# Patient Record
Sex: Female | Born: 1960
Health system: Southern US, Community
[De-identification: ages and names within clinical notes are randomized; demographics above are authoritative.]

## PROBLEM LIST (undated history)

## (undated) DIAGNOSIS — M549 Dorsalgia, unspecified: Secondary | ICD-10-CM

## (undated) DIAGNOSIS — G5601 Carpal tunnel syndrome, right upper limb: Secondary | ICD-10-CM

## (undated) DIAGNOSIS — E785 Hyperlipidemia, unspecified: Secondary | ICD-10-CM

## (undated) DIAGNOSIS — G43909 Migraine, unspecified, not intractable, without status migrainosus: Secondary | ICD-10-CM

## (undated) DIAGNOSIS — G4730 Sleep apnea, unspecified: Secondary | ICD-10-CM

## (undated) DIAGNOSIS — E119 Type 2 diabetes mellitus without complications: Secondary | ICD-10-CM

## (undated) DIAGNOSIS — M797 Fibromyalgia: Secondary | ICD-10-CM

## (undated) DIAGNOSIS — F329 Major depressive disorder, single episode, unspecified: Secondary | ICD-10-CM

## (undated) DIAGNOSIS — D219 Benign neoplasm of connective and other soft tissue, unspecified: Secondary | ICD-10-CM

## (undated) DIAGNOSIS — M1990 Unspecified osteoarthritis, unspecified site: Secondary | ICD-10-CM

## (undated) DIAGNOSIS — N920 Excessive and frequent menstruation with regular cycle: Secondary | ICD-10-CM

## (undated) DIAGNOSIS — B009 Herpesviral infection, unspecified: Secondary | ICD-10-CM

## (undated) DIAGNOSIS — Z9289 Personal history of other medical treatment: Secondary | ICD-10-CM

## (undated) DIAGNOSIS — I10 Essential (primary) hypertension: Secondary | ICD-10-CM

## (undated) DIAGNOSIS — D649 Anemia, unspecified: Secondary | ICD-10-CM

## (undated) DIAGNOSIS — G8929 Other chronic pain: Secondary | ICD-10-CM

## (undated) DIAGNOSIS — T7840XA Allergy, unspecified, initial encounter: Secondary | ICD-10-CM

## (undated) DIAGNOSIS — F419 Anxiety disorder, unspecified: Secondary | ICD-10-CM

## (undated) DIAGNOSIS — F32A Depression, unspecified: Secondary | ICD-10-CM

## (undated) HISTORY — DX: Other chronic pain: G89.29

## (undated) HISTORY — DX: Hyperlipidemia, unspecified: E78.5

## (undated) HISTORY — PX: ENDOMETRIAL ABLATION: SHX621

## (undated) HISTORY — DX: Depression, unspecified: F32.A

## (undated) HISTORY — DX: Benign neoplasm of connective and other soft tissue, unspecified: D21.9

## (undated) HISTORY — PX: NECK SURGERY: SHX720

## (undated) HISTORY — DX: Major depressive disorder, single episode, unspecified: F32.9

## (undated) HISTORY — DX: Dorsalgia, unspecified: M54.9

## (undated) HISTORY — DX: Migraine, unspecified, not intractable, without status migrainosus: G43.909

## (undated) HISTORY — DX: Anemia, unspecified: D64.9

## (undated) HISTORY — PX: COLONOSCOPY: SHX174

## (undated) HISTORY — PX: TUBAL LIGATION: SHX77

## (undated) HISTORY — DX: Sleep apnea, unspecified: G47.30

## (undated) HISTORY — DX: Allergy, unspecified, initial encounter: T78.40XA

## (undated) HISTORY — PX: OTHER SURGICAL HISTORY: SHX169

## (undated) HISTORY — DX: Type 2 diabetes mellitus without complications: E11.9

## (undated) HISTORY — DX: Excessive and frequent menstruation with regular cycle: N92.0

## (undated) HISTORY — DX: Morbid (severe) obesity due to excess calories: E66.01

## (undated) HISTORY — PX: WISDOM TOOTH EXTRACTION: SHX21

---

## 1998-07-04 ENCOUNTER — Emergency Department (HOSPITAL_COMMUNITY): Admission: EM | Admit: 1998-07-04 | Discharge: 1998-07-04 | Payer: Self-pay | Admitting: Emergency Medicine

## 1998-07-09 ENCOUNTER — Ambulatory Visit (HOSPITAL_COMMUNITY): Admission: RE | Admit: 1998-07-09 | Discharge: 1998-07-09 | Payer: Self-pay | Admitting: Cardiology

## 1998-12-11 ENCOUNTER — Other Ambulatory Visit: Admission: RE | Admit: 1998-12-11 | Discharge: 1998-12-11 | Payer: Self-pay | Admitting: Obstetrics

## 1999-08-26 ENCOUNTER — Encounter: Payer: Self-pay | Admitting: Cardiology

## 1999-08-26 ENCOUNTER — Ambulatory Visit (HOSPITAL_COMMUNITY): Admission: RE | Admit: 1999-08-26 | Discharge: 1999-08-26 | Payer: Self-pay | Admitting: Cardiology

## 1999-09-20 ENCOUNTER — Encounter: Admission: RE | Admit: 1999-09-20 | Discharge: 1999-09-20 | Payer: Self-pay | Admitting: Obstetrics

## 1999-09-20 ENCOUNTER — Encounter: Payer: Self-pay | Admitting: Obstetrics

## 2000-03-10 ENCOUNTER — Encounter: Admission: RE | Admit: 2000-03-10 | Discharge: 2000-03-10 | Payer: Self-pay | Admitting: Cardiology

## 2000-03-10 ENCOUNTER — Encounter: Payer: Self-pay | Admitting: Cardiology

## 2000-03-25 ENCOUNTER — Encounter: Payer: Self-pay | Admitting: Obstetrics

## 2000-03-25 ENCOUNTER — Encounter: Admission: RE | Admit: 2000-03-25 | Discharge: 2000-03-25 | Payer: Self-pay | Admitting: Obstetrics

## 2000-11-25 ENCOUNTER — Other Ambulatory Visit: Admission: RE | Admit: 2000-11-25 | Discharge: 2000-11-25 | Payer: Self-pay | Admitting: Obstetrics

## 2000-12-06 ENCOUNTER — Emergency Department (HOSPITAL_COMMUNITY): Admission: EM | Admit: 2000-12-06 | Discharge: 2000-12-06 | Payer: Self-pay | Admitting: Emergency Medicine

## 2001-03-30 ENCOUNTER — Encounter: Admission: RE | Admit: 2001-03-30 | Discharge: 2001-03-30 | Payer: Self-pay | Admitting: Obstetrics

## 2001-03-30 ENCOUNTER — Encounter: Payer: Self-pay | Admitting: Obstetrics

## 2001-05-19 ENCOUNTER — Encounter: Payer: Self-pay | Admitting: Cardiology

## 2001-05-19 ENCOUNTER — Encounter: Admission: RE | Admit: 2001-05-19 | Discharge: 2001-05-19 | Payer: Self-pay | Admitting: Cardiology

## 2001-09-28 ENCOUNTER — Emergency Department (HOSPITAL_COMMUNITY): Admission: EM | Admit: 2001-09-28 | Discharge: 2001-09-28 | Payer: Self-pay | Admitting: Emergency Medicine

## 2002-04-07 ENCOUNTER — Encounter: Payer: Self-pay | Admitting: Obstetrics

## 2002-04-07 ENCOUNTER — Encounter: Admission: RE | Admit: 2002-04-07 | Discharge: 2002-04-07 | Payer: Self-pay | Admitting: Obstetrics

## 2002-04-07 ENCOUNTER — Encounter: Payer: Self-pay | Admitting: Cardiology

## 2002-09-13 ENCOUNTER — Emergency Department (HOSPITAL_COMMUNITY): Admission: EM | Admit: 2002-09-13 | Discharge: 2002-09-13 | Payer: Self-pay | Admitting: Emergency Medicine

## 2003-01-27 ENCOUNTER — Emergency Department (HOSPITAL_COMMUNITY): Admission: EM | Admit: 2003-01-27 | Discharge: 2003-01-27 | Payer: Self-pay | Admitting: Emergency Medicine

## 2003-04-13 ENCOUNTER — Encounter: Admission: RE | Admit: 2003-04-13 | Discharge: 2003-04-13 | Payer: Self-pay | Admitting: Obstetrics

## 2003-04-13 ENCOUNTER — Encounter: Payer: Self-pay | Admitting: Obstetrics

## 2003-05-06 ENCOUNTER — Encounter: Payer: Self-pay | Admitting: Emergency Medicine

## 2003-05-06 ENCOUNTER — Emergency Department (HOSPITAL_COMMUNITY): Admission: EM | Admit: 2003-05-06 | Discharge: 2003-05-06 | Payer: Self-pay | Admitting: Emergency Medicine

## 2003-05-27 ENCOUNTER — Encounter: Admission: RE | Admit: 2003-05-27 | Discharge: 2003-05-27 | Payer: Self-pay | Admitting: Cardiology

## 2003-05-27 ENCOUNTER — Encounter: Payer: Self-pay | Admitting: Cardiology

## 2003-09-25 ENCOUNTER — Encounter: Admission: RE | Admit: 2003-09-25 | Discharge: 2003-09-25 | Payer: Self-pay | Admitting: Obstetrics

## 2003-11-21 ENCOUNTER — Encounter: Admission: RE | Admit: 2003-11-21 | Discharge: 2003-11-21 | Payer: Self-pay | Admitting: Cardiology

## 2003-12-14 ENCOUNTER — Emergency Department (HOSPITAL_COMMUNITY): Admission: EM | Admit: 2003-12-14 | Discharge: 2003-12-14 | Payer: Self-pay | Admitting: Family Medicine

## 2005-04-09 ENCOUNTER — Emergency Department (HOSPITAL_COMMUNITY): Admission: EM | Admit: 2005-04-09 | Discharge: 2005-04-09 | Payer: Self-pay | Admitting: Family Medicine

## 2005-09-23 ENCOUNTER — Ambulatory Visit (HOSPITAL_COMMUNITY): Admission: RE | Admit: 2005-09-23 | Discharge: 2005-09-23 | Payer: Self-pay | Admitting: Obstetrics

## 2005-10-07 ENCOUNTER — Encounter: Admission: RE | Admit: 2005-10-07 | Discharge: 2005-10-07 | Payer: Self-pay | Admitting: Cardiology

## 2005-11-10 ENCOUNTER — Ambulatory Visit (HOSPITAL_COMMUNITY): Admission: RE | Admit: 2005-11-10 | Discharge: 2005-11-10 | Payer: Self-pay | Admitting: Cardiology

## 2005-12-16 ENCOUNTER — Ambulatory Visit: Payer: Self-pay | Admitting: Internal Medicine

## 2006-01-20 ENCOUNTER — Ambulatory Visit: Payer: Self-pay | Admitting: Internal Medicine

## 2006-01-22 ENCOUNTER — Ambulatory Visit (HOSPITAL_COMMUNITY): Admission: RE | Admit: 2006-01-22 | Discharge: 2006-01-22 | Payer: Self-pay | Admitting: Neurosurgery

## 2006-01-29 ENCOUNTER — Encounter: Admission: RE | Admit: 2006-01-29 | Discharge: 2006-01-29 | Payer: Self-pay | Admitting: Neurosurgery

## 2006-02-17 ENCOUNTER — Inpatient Hospital Stay (HOSPITAL_COMMUNITY): Admission: RE | Admit: 2006-02-17 | Discharge: 2006-02-19 | Payer: Self-pay | Admitting: Neurosurgery

## 2006-03-26 ENCOUNTER — Encounter: Admission: RE | Admit: 2006-03-26 | Discharge: 2006-03-26 | Payer: Self-pay | Admitting: *Deleted

## 2006-04-18 ENCOUNTER — Emergency Department (HOSPITAL_COMMUNITY): Admission: EM | Admit: 2006-04-18 | Discharge: 2006-04-18 | Payer: Self-pay | Admitting: Family Medicine

## 2006-06-04 ENCOUNTER — Ambulatory Visit: Payer: Self-pay | Admitting: Physical Medicine & Rehabilitation

## 2006-06-04 ENCOUNTER — Encounter
Admission: RE | Admit: 2006-06-04 | Discharge: 2006-09-02 | Payer: Self-pay | Admitting: Physical Medicine & Rehabilitation

## 2006-06-11 ENCOUNTER — Encounter
Admission: RE | Admit: 2006-06-11 | Discharge: 2006-07-03 | Payer: Self-pay | Admitting: Physical Medicine & Rehabilitation

## 2006-07-14 ENCOUNTER — Ambulatory Visit: Payer: Self-pay | Admitting: Physical Medicine & Rehabilitation

## 2006-08-06 ENCOUNTER — Ambulatory Visit: Payer: Self-pay | Admitting: Physical Medicine & Rehabilitation

## 2006-09-04 ENCOUNTER — Encounter
Admission: RE | Admit: 2006-09-04 | Discharge: 2006-12-03 | Payer: Self-pay | Admitting: Physical Medicine & Rehabilitation

## 2006-09-30 ENCOUNTER — Ambulatory Visit: Payer: Self-pay | Admitting: Physical Medicine & Rehabilitation

## 2006-10-02 ENCOUNTER — Ambulatory Visit (HOSPITAL_COMMUNITY): Admission: RE | Admit: 2006-10-02 | Discharge: 2006-10-02 | Payer: Self-pay | Admitting: Obstetrics

## 2006-11-24 ENCOUNTER — Encounter
Admission: RE | Admit: 2006-11-24 | Discharge: 2007-02-22 | Payer: Self-pay | Admitting: Physical Medicine & Rehabilitation

## 2006-12-11 ENCOUNTER — Ambulatory Visit: Payer: Self-pay | Admitting: Physical Medicine & Rehabilitation

## 2007-02-04 ENCOUNTER — Ambulatory Visit: Payer: Self-pay | Admitting: Physical Medicine & Rehabilitation

## 2007-03-05 ENCOUNTER — Encounter
Admission: RE | Admit: 2007-03-05 | Discharge: 2007-06-03 | Payer: Self-pay | Admitting: Physical Medicine & Rehabilitation

## 2007-03-22 ENCOUNTER — Encounter
Admission: RE | Admit: 2007-03-22 | Discharge: 2007-05-17 | Payer: Self-pay | Admitting: Physical Medicine & Rehabilitation

## 2007-05-05 ENCOUNTER — Emergency Department (HOSPITAL_COMMUNITY): Admission: EM | Admit: 2007-05-05 | Discharge: 2007-05-05 | Payer: Self-pay | Admitting: Emergency Medicine

## 2007-05-07 ENCOUNTER — Ambulatory Visit: Payer: Self-pay | Admitting: Internal Medicine

## 2007-05-07 ENCOUNTER — Ambulatory Visit: Payer: Self-pay | Admitting: *Deleted

## 2007-05-07 ENCOUNTER — Ambulatory Visit (HOSPITAL_COMMUNITY): Admission: RE | Admit: 2007-05-07 | Discharge: 2007-05-07 | Payer: Self-pay | Admitting: Internal Medicine

## 2007-05-14 ENCOUNTER — Ambulatory Visit: Payer: Self-pay | Admitting: Internal Medicine

## 2007-08-09 ENCOUNTER — Ambulatory Visit: Payer: Self-pay | Admitting: Internal Medicine

## 2007-08-09 ENCOUNTER — Encounter: Payer: Self-pay | Admitting: Family Medicine

## 2007-08-09 LAB — CONVERTED CEMR LAB
BUN: 13 mg/dL (ref 6–23)
Basophils Absolute: 0 10*3/uL (ref 0.0–0.1)
Basophils Relative: 0 % (ref 0–1)
Chlamydia, DNA Probe: NEGATIVE
Chloride: 103 meq/L (ref 96–112)
Eosinophils Absolute: 0.1 10*3/uL — ABNORMAL LOW (ref 0.2–0.7)
FSH: 3.3 milliintl units/mL
GC Probe Amp, Genital: NEGATIVE
MCHC: 30.8 g/dL (ref 30.0–36.0)
MCV: 76.1 fL — ABNORMAL LOW (ref 78.0–100.0)
Monocytes Relative: 9 % (ref 3–12)
Neutro Abs: 5.6 10*3/uL (ref 1.7–7.7)
Neutrophils Relative %: 65 % (ref 43–77)
Platelets: 430 10*3/uL — ABNORMAL HIGH (ref 150–400)
Potassium: 3.8 meq/L (ref 3.5–5.3)
RBC: 4.27 M/uL (ref 3.87–5.11)
RDW: 17.7 % — ABNORMAL HIGH (ref 11.5–15.5)
Sodium: 137 meq/L (ref 135–145)

## 2007-08-24 ENCOUNTER — Ambulatory Visit: Payer: Self-pay | Admitting: Internal Medicine

## 2007-08-24 LAB — CONVERTED CEMR LAB
Cholesterol: 220 mg/dL — ABNORMAL HIGH (ref 0–200)
HDL: 49 mg/dL (ref >39)
LDL Cholesterol: 158 mg/dL — ABNORMAL HIGH (ref 0–99)
Triglycerides: 63 mg/dL (ref <150)

## 2007-09-21 ENCOUNTER — Ambulatory Visit: Payer: Self-pay | Admitting: Internal Medicine

## 2007-10-08 ENCOUNTER — Ambulatory Visit (HOSPITAL_COMMUNITY): Admission: RE | Admit: 2007-10-08 | Discharge: 2007-10-08 | Payer: Self-pay | Admitting: Family Medicine

## 2007-10-12 ENCOUNTER — Ambulatory Visit: Payer: Self-pay | Admitting: Internal Medicine

## 2007-10-12 LAB — CONVERTED CEMR LAB
AST: 13 units/L (ref 0–37)
Albumin: 4.4 g/dL (ref 3.5–5.2)
BUN: 16 mg/dL (ref 6–23)
CO2: 23 meq/L (ref 19–32)
Calcium: 9.3 mg/dL (ref 8.4–10.5)
Chloride: 104 meq/L (ref 96–112)
Creatinine, Ser: 1.05 mg/dL (ref 0.40–1.20)
Potassium: 4 meq/L (ref 3.5–5.3)

## 2007-10-25 ENCOUNTER — Ambulatory Visit: Payer: Self-pay | Admitting: Internal Medicine

## 2007-11-29 ENCOUNTER — Ambulatory Visit: Payer: Self-pay | Admitting: Internal Medicine

## 2007-11-30 ENCOUNTER — Ambulatory Visit (HOSPITAL_COMMUNITY): Admission: RE | Admit: 2007-11-30 | Discharge: 2007-11-30 | Payer: Self-pay | Admitting: Family Medicine

## 2008-01-28 ENCOUNTER — Ambulatory Visit: Payer: Self-pay | Admitting: Internal Medicine

## 2008-02-04 ENCOUNTER — Ambulatory Visit: Payer: Self-pay | Admitting: Family Medicine

## 2008-03-03 ENCOUNTER — Ambulatory Visit: Payer: Self-pay | Admitting: Internal Medicine

## 2008-09-19 ENCOUNTER — Encounter: Admission: RE | Admit: 2008-09-19 | Discharge: 2008-09-19 | Payer: Self-pay | Admitting: Cardiology

## 2008-10-09 ENCOUNTER — Ambulatory Visit (HOSPITAL_COMMUNITY): Admission: RE | Admit: 2008-10-09 | Discharge: 2008-10-09 | Payer: Self-pay | Admitting: Cardiology

## 2008-10-18 ENCOUNTER — Encounter (HOSPITAL_COMMUNITY): Admission: RE | Admit: 2008-10-18 | Discharge: 2009-01-16 | Payer: Self-pay | Admitting: Internal Medicine

## 2008-11-13 ENCOUNTER — Ambulatory Visit (HOSPITAL_COMMUNITY): Admission: RE | Admit: 2008-11-13 | Discharge: 2008-11-13 | Payer: Self-pay | Admitting: Internal Medicine

## 2008-11-24 ENCOUNTER — Encounter: Admission: RE | Admit: 2008-11-24 | Discharge: 2008-11-24 | Payer: Self-pay | Admitting: Cardiology

## 2008-12-28 ENCOUNTER — Encounter: Admission: RE | Admit: 2008-12-28 | Discharge: 2008-12-28 | Payer: Self-pay | Admitting: Internal Medicine

## 2008-12-28 ENCOUNTER — Other Ambulatory Visit: Admission: RE | Admit: 2008-12-28 | Discharge: 2008-12-28 | Payer: Self-pay | Admitting: Interventional Radiology

## 2008-12-28 ENCOUNTER — Encounter (INDEPENDENT_AMBULATORY_CARE_PROVIDER_SITE_OTHER): Payer: Self-pay | Admitting: Interventional Radiology

## 2009-02-21 ENCOUNTER — Ambulatory Visit: Payer: Self-pay | Admitting: Family Medicine

## 2009-03-15 ENCOUNTER — Encounter: Admission: RE | Admit: 2009-03-15 | Discharge: 2009-03-15 | Payer: Self-pay | Admitting: Cardiology

## 2009-03-15 ENCOUNTER — Ambulatory Visit (HOSPITAL_BASED_OUTPATIENT_CLINIC_OR_DEPARTMENT_OTHER): Admission: RE | Admit: 2009-03-15 | Discharge: 2009-03-15 | Payer: Self-pay | Admitting: Cardiology

## 2009-03-17 ENCOUNTER — Ambulatory Visit: Payer: Self-pay | Admitting: Internal Medicine

## 2009-03-22 ENCOUNTER — Ambulatory Visit (HOSPITAL_COMMUNITY): Admission: RE | Admit: 2009-03-22 | Discharge: 2009-03-22 | Payer: Self-pay | Admitting: Cardiology

## 2009-03-26 ENCOUNTER — Emergency Department (HOSPITAL_COMMUNITY): Admission: EM | Admit: 2009-03-26 | Discharge: 2009-03-26 | Payer: Self-pay | Admitting: Emergency Medicine

## 2009-03-28 ENCOUNTER — Emergency Department (HOSPITAL_COMMUNITY): Admission: EM | Admit: 2009-03-28 | Discharge: 2009-03-28 | Payer: Self-pay | Admitting: Emergency Medicine

## 2009-03-30 ENCOUNTER — Ambulatory Visit: Payer: Self-pay | Admitting: Internal Medicine

## 2009-06-22 ENCOUNTER — Inpatient Hospital Stay (HOSPITAL_COMMUNITY): Admission: RE | Admit: 2009-06-22 | Discharge: 2009-06-23 | Payer: Self-pay | Admitting: Neurosurgery

## 2009-06-22 IMAGING — CR DG CERVICAL SPINE 2 OR 3 VIEWS
1 series · 1 of 1 positions shown · non-contrast
Comparison: Cervical spine x-rays [DATE].

CLINICAL DATA: C5-6 disc herniation.  ACDF.

CERVICAL SPINE - 2-3 VIEW [DATE]:

[view not recorded]
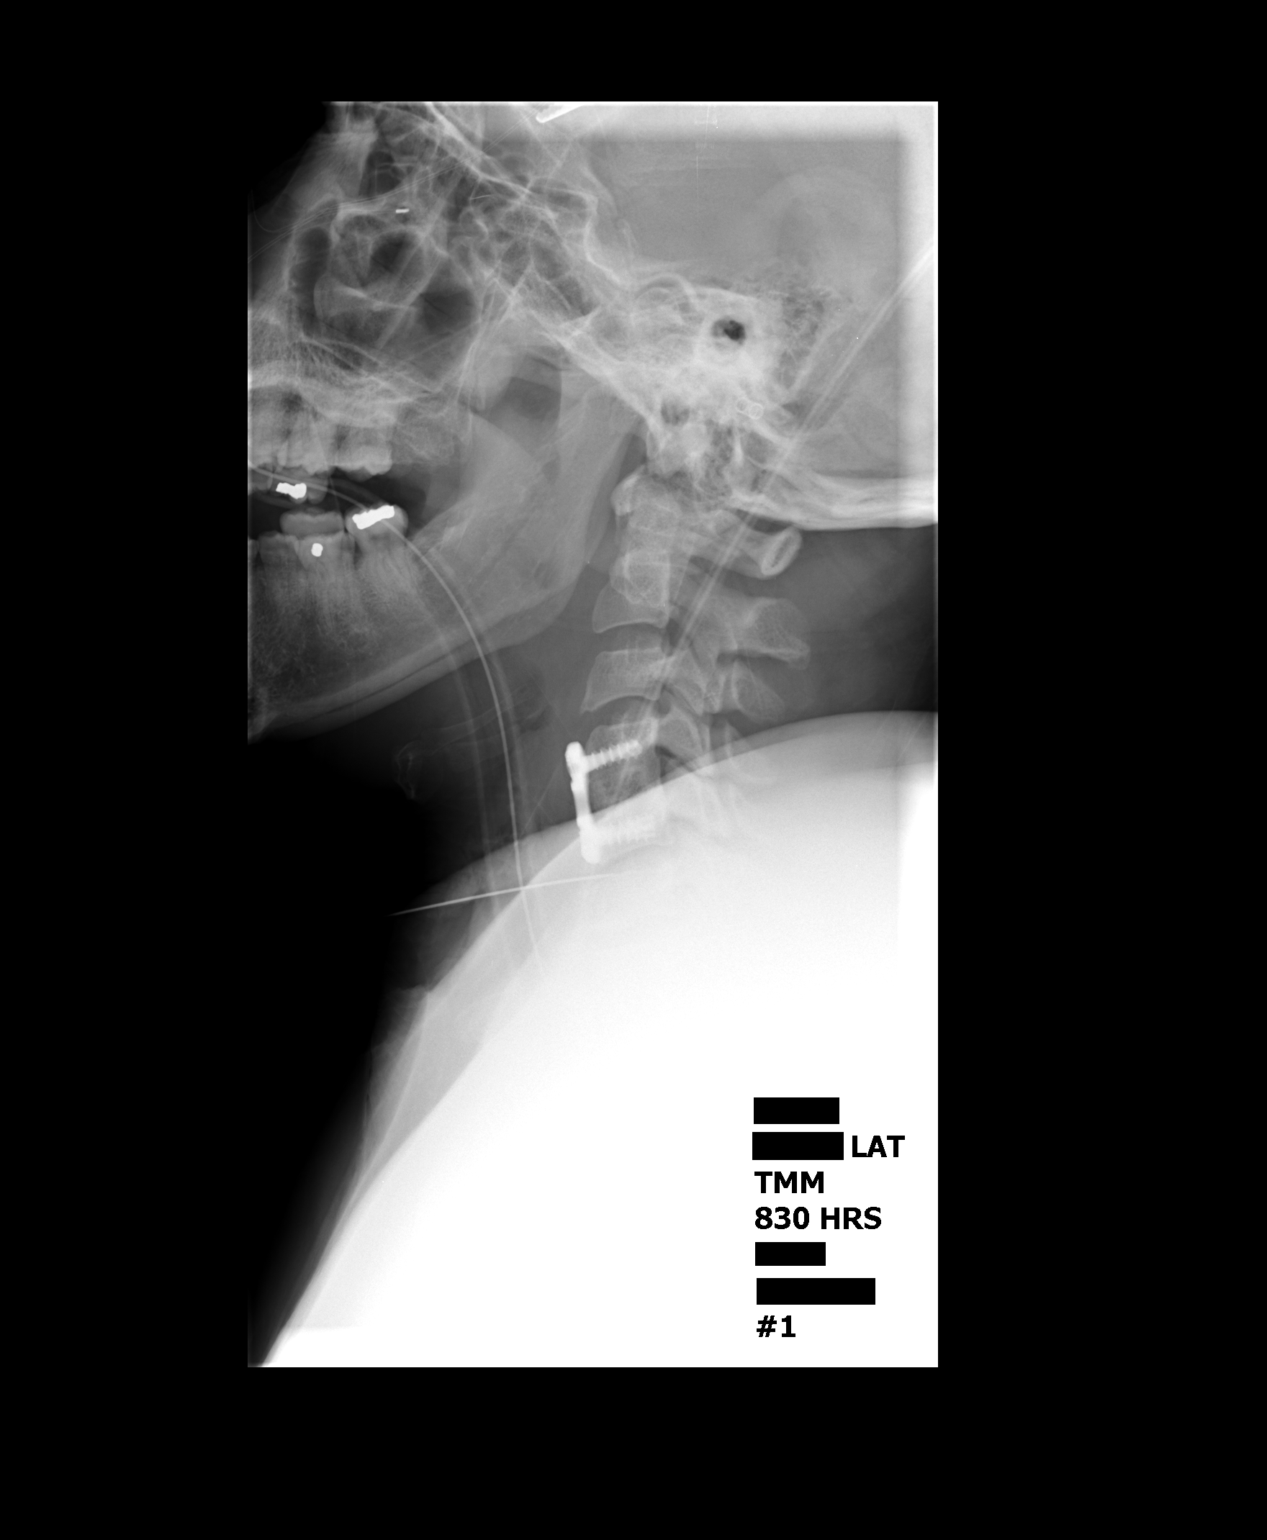

[1 of 1 positions shown; findings below may reference images not displayed]

FINDINGS: Images were submitted for interpretation post-
operatively.  Initial image obtained at [VY] hours demonstrates the
localizer needle projected over the C5-6 disc space.  Prior C4-5
ACDF.  Second image obtained at [VY] hours demonstrates removal of
the C4-5 hardware with placement of hardware at C5-6.  Bone fusion
plug appropriately positioned in the C5-6 disc space.  Alignment
anatomic.
IMPRESSION: Anatomic alignment status post ACDF with hardware C5-6.

## 2009-07-09 ENCOUNTER — Ambulatory Visit: Payer: Self-pay | Admitting: Internal Medicine

## 2009-07-10 ENCOUNTER — Encounter: Payer: Self-pay | Admitting: Family Medicine

## 2009-08-13 ENCOUNTER — Encounter: Admission: RE | Admit: 2009-08-13 | Discharge: 2009-08-13 | Payer: Self-pay | Admitting: Neurosurgery

## 2009-08-14 ENCOUNTER — Encounter: Admission: RE | Admit: 2009-08-14 | Discharge: 2009-08-14 | Payer: Self-pay | Admitting: Cardiology

## 2009-09-10 ENCOUNTER — Ambulatory Visit: Payer: Self-pay | Admitting: Internal Medicine

## 2009-09-10 LAB — CONVERTED CEMR LAB
BUN: 11 mg/dL
CO2: 19 meq/L
Calcium: 9 mg/dL
Chlamydia, DNA Probe: NEGATIVE
Chloride: 103 meq/L
Cholesterol: 146 mg/dL
Creatinine, Ser: 0.93 mg/dL
Direct LDL: 86 mg/dL
GC Probe Amp, Genital: NEGATIVE
Glucose, Bld: 104 mg/dL — ABNORMAL HIGH
HDL: 48 mg/dL
LDL Cholesterol: 84 mg/dL
Potassium: 4.1 meq/L
Sodium: 140 meq/L
Total CHOL/HDL Ratio: 3
Triglycerides: 72 mg/dL
VLDL: 14 mg/dL

## 2009-10-10 ENCOUNTER — Ambulatory Visit (HOSPITAL_COMMUNITY): Admission: RE | Admit: 2009-10-10 | Discharge: 2009-10-10 | Payer: Self-pay | Admitting: Cardiology

## 2010-01-01 ENCOUNTER — Emergency Department (HOSPITAL_COMMUNITY): Admission: EM | Admit: 2010-01-01 | Discharge: 2010-01-01 | Payer: Self-pay | Admitting: Family Medicine

## 2010-01-14 ENCOUNTER — Ambulatory Visit (HOSPITAL_COMMUNITY): Admission: RE | Admit: 2010-01-14 | Discharge: 2010-01-14 | Payer: Self-pay | Admitting: Internal Medicine

## 2010-04-06 ENCOUNTER — Emergency Department (HOSPITAL_COMMUNITY): Admission: EM | Admit: 2010-04-06 | Discharge: 2010-04-06 | Payer: Self-pay | Admitting: Family Medicine

## 2010-04-07 ENCOUNTER — Ambulatory Visit: Payer: Self-pay | Admitting: Nurse Practitioner

## 2010-04-07 ENCOUNTER — Inpatient Hospital Stay (HOSPITAL_COMMUNITY): Admission: AD | Admit: 2010-04-07 | Discharge: 2010-04-07 | Payer: Self-pay | Admitting: Obstetrics and Gynecology

## 2010-04-28 ENCOUNTER — Inpatient Hospital Stay (HOSPITAL_COMMUNITY): Admission: AD | Admit: 2010-04-28 | Discharge: 2010-04-29 | Payer: Self-pay | Admitting: Obstetrics and Gynecology

## 2010-05-22 ENCOUNTER — Encounter: Payer: Self-pay | Admitting: Physician Assistant

## 2010-05-22 ENCOUNTER — Ambulatory Visit: Payer: Self-pay | Admitting: Obstetrics & Gynecology

## 2010-05-22 ENCOUNTER — Other Ambulatory Visit: Admission: RE | Admit: 2010-05-22 | Discharge: 2010-05-22 | Payer: Self-pay | Admitting: Obstetrics & Gynecology

## 2010-05-22 LAB — CONVERTED CEMR LAB
HCT: 22.6 % — ABNORMAL LOW (ref 36.0–46.0)
MCHC: 29.6 g/dL — ABNORMAL LOW (ref 30.0–36.0)
MCV: 77.1 fL — ABNORMAL LOW (ref 78.0–100.0)
RBC: 2.93 M/uL — ABNORMAL LOW (ref 3.87–5.11)
WBC: 9.7 10*3/uL (ref 4.0–10.5)

## 2010-06-06 ENCOUNTER — Ambulatory Visit: Payer: Self-pay | Admitting: Obstetrics and Gynecology

## 2010-07-25 ENCOUNTER — Ambulatory Visit: Payer: Self-pay | Admitting: Obstetrics & Gynecology

## 2010-08-13 ENCOUNTER — Ambulatory Visit (HOSPITAL_COMMUNITY)
Admission: RE | Admit: 2010-08-13 | Discharge: 2010-08-13 | Payer: Self-pay | Source: Home / Self Care | Admitting: Obstetrics & Gynecology

## 2010-08-26 ENCOUNTER — Encounter: Payer: Self-pay | Admitting: Orthopedic Surgery

## 2010-09-11 ENCOUNTER — Ambulatory Visit
Admission: RE | Admit: 2010-09-11 | Discharge: 2010-09-11 | Payer: Self-pay | Source: Home / Self Care | Attending: Obstetrics & Gynecology | Admitting: Obstetrics & Gynecology

## 2010-09-26 ENCOUNTER — Other Ambulatory Visit (HOSPITAL_COMMUNITY): Payer: Self-pay | Admitting: *Deleted

## 2010-09-26 DIAGNOSIS — Z0000 Encounter for general adult medical examination without abnormal findings: Secondary | ICD-10-CM

## 2010-09-26 DIAGNOSIS — Z1231 Encounter for screening mammogram for malignant neoplasm of breast: Secondary | ICD-10-CM

## 2010-10-11 ENCOUNTER — Ambulatory Visit (HOSPITAL_COMMUNITY): Admission: RE | Admit: 2010-10-11 | Payer: Self-pay | Source: Home / Self Care | Admitting: Obstetrics & Gynecology

## 2010-10-11 ENCOUNTER — Ambulatory Visit (HOSPITAL_COMMUNITY): Payer: Medicare Other

## 2010-10-14 ENCOUNTER — Ambulatory Visit (HOSPITAL_COMMUNITY): Payer: Self-pay

## 2010-10-17 ENCOUNTER — Ambulatory Visit (HOSPITAL_COMMUNITY)
Admission: RE | Admit: 2010-10-17 | Discharge: 2010-10-17 | Disposition: A | Discharge status: Home / Self Care | Payer: Medicare Other | Source: Ambulatory Visit | Attending: Cardiology | Admitting: Cardiology

## 2010-10-17 DIAGNOSIS — Z1231 Encounter for screening mammogram for malignant neoplasm of breast: Secondary | ICD-10-CM

## 2010-11-19 LAB — CBC
MCH: 23.5 pg — ABNORMAL LOW (ref 26.0–34.0)
MCHC: 30.8 g/dL (ref 30.0–36.0)
MCV: 76.2 fL — ABNORMAL LOW (ref 78.0–100.0)
Platelets: 365 10*3/uL (ref 150–400)
RDW: 22.3 % — ABNORMAL HIGH (ref 11.5–15.5)

## 2010-11-19 LAB — BASIC METABOLIC PANEL
BUN: 10 mg/dL (ref 6–23)
CO2: 26 mEq/L (ref 19–32)
Chloride: 105 mEq/L (ref 96–112)
Creatinine, Ser: 0.77 mg/dL (ref 0.4–1.2)
Glucose, Bld: 122 mg/dL — ABNORMAL HIGH (ref 70–99)

## 2010-11-19 LAB — TYPE AND SCREEN

## 2010-11-22 LAB — CROSSMATCH
ABO/RH(D): A POS
Antibody Screen: NEGATIVE

## 2010-11-22 LAB — CBC
HCT: 22.3 % — ABNORMAL LOW (ref 36.0–46.0)
HCT: 32.9 % — ABNORMAL LOW (ref 36.0–46.0)
Hemoglobin: 7 g/dL — ABNORMAL LOW (ref 12.0–15.0)
MCH: 24.5 pg — ABNORMAL LOW (ref 26.0–34.0)
MCHC: 31.5 g/dL (ref 30.0–36.0)
MCHC: 32.5 g/dL (ref 30.0–36.0)
MCV: 83.5 fL (ref 78.0–100.0)
Platelets: 279 10*3/uL (ref 150–400)
RBC: 2.86 MIL/uL — ABNORMAL LOW (ref 3.87–5.11)
RDW: 17.4 % — ABNORMAL HIGH (ref 11.5–15.5)
RDW: 18.3 % — ABNORMAL HIGH (ref 11.5–15.5)
WBC: 13.3 10*3/uL — ABNORMAL HIGH (ref 4.0–10.5)
WBC: 9 10*3/uL (ref 4.0–10.5)

## 2010-11-23 LAB — POCT URINALYSIS DIP (DEVICE)
Bilirubin Urine: NEGATIVE
Glucose, UA: NEGATIVE mg/dL
Nitrite: POSITIVE — AB
Urobilinogen, UA: 0.2 mg/dL (ref 0.0–1.0)
pH: 6 (ref 5.0–8.0)

## 2010-11-23 LAB — POCT I-STAT, CHEM 8
BUN: 13 mg/dL (ref 6–23)
Calcium, Ion: 1.1 mmol/L — ABNORMAL LOW (ref 1.12–1.32)
Chloride: 105 mEq/L (ref 96–112)
Creatinine, Ser: 1 mg/dL (ref 0.4–1.2)
Glucose, Bld: 100 mg/dL — ABNORMAL HIGH (ref 70–99)
TCO2: 24 mmol/L (ref 0–100)

## 2010-11-23 LAB — URINE CULTURE

## 2010-11-23 LAB — WET PREP, GENITAL
Trich, Wet Prep: NONE SEEN
Yeast Wet Prep HPF POC: NONE SEEN

## 2010-11-23 LAB — GC/CHLAMYDIA PROBE AMP, GENITAL: Chlamydia, DNA Probe: NEGATIVE

## 2010-12-10 ENCOUNTER — Other Ambulatory Visit: Payer: Self-pay | Admitting: Family Medicine

## 2010-12-12 LAB — BASIC METABOLIC PANEL
BUN: 10 mg/dL (ref 6–23)
CO2: 27 mEq/L (ref 19–32)
Calcium: 9 mg/dL (ref 8.4–10.5)
Chloride: 105 mEq/L (ref 96–112)
Creatinine, Ser: 0.87 mg/dL (ref 0.4–1.2)
GFR calc Af Amer: >60 mL/min (ref >60)
Glucose, Bld: 101 mg/dL — ABNORMAL HIGH (ref 70–99)

## 2010-12-12 LAB — CBC
MCHC: 32.5 g/dL (ref 30.0–36.0)
MCV: 77.5 fL — ABNORMAL LOW (ref 78.0–100.0)
RBC: 4.24 MIL/uL (ref 3.87–5.11)
RDW: 16.7 % — ABNORMAL HIGH (ref 11.5–15.5)

## 2011-01-06 ENCOUNTER — Other Ambulatory Visit (HOSPITAL_COMMUNITY): Payer: Self-pay | Admitting: Cardiology

## 2011-01-06 DIAGNOSIS — Z1231 Encounter for screening mammogram for malignant neoplasm of breast: Secondary | ICD-10-CM

## 2011-01-09 ENCOUNTER — Ambulatory Visit: Payer: Medicare Other | Admitting: Obstetrics and Gynecology

## 2011-01-09 ENCOUNTER — Ambulatory Visit: Payer: Medicare Other | Admitting: Physician Assistant

## 2011-01-09 ENCOUNTER — Other Ambulatory Visit: Payer: Self-pay | Admitting: Obstetrics and Gynecology

## 2011-01-09 DIAGNOSIS — N920 Excessive and frequent menstruation with regular cycle: Secondary | ICD-10-CM

## 2011-01-10 NOTE — Group Therapy Note (Signed)
NAMESENIYA, STOFFERS                  ACCOUNT NO.:  [id number]  MEDICAL RECORD NO.:  [id number]           PATIENT TYPE:  A  LOCATION:  [facility]                   FACILITY:  [facility]  PHYSICIAN:  Catalina Antigua, MD     DATE OF BIRTH:  1960-11-17  DATE OF SERVICE:  01/09/2011                                 CLINIC NOTE  This is a 50 year old G2, P2 who is status post endometrial ablation for management of dysfunctional uterine bleeding in December 2011 who presents today for annual exam.  The patient is currently without any complaints.  Denies abnormal bleeding or pelvic pain.  The patient is quite concerned with the results of her recent procedure.  PAST MEDICAL HISTORY:  Significant for hypertension, anxiety, depression, and hypothyroidism.  PAST SURGICAL HISTORY:  She has had two C-section x2 and a bilateral tubal ligation.  PAST GYNECOLOGIC HISTORY:  She denies any cyst or fibroids or history of abnormal Pap smear.  FAMILY HISTORY:  Significant for hypertension.  SOCIAL HISTORY:  She denies drinking, smoking, and the use of illicit drugs.  REVIEW OF SYSTEMS:  Otherwise, within normal limits.  PHYSICAL EXAMINATION:  VITAL SIGNS:  Her blood pressure is 136/84, pulse of 104, weight of 121 kg, height of 5 feet 5 inches. LUNGS:  Clear to auscultation bilaterally. HEART:  Regular rate and rhythm. BREASTS:  Nontender, equal in size.  No expressible nipple discharge. No palpable masses or lymphadenopathy. ABDOMEN:  Soft, nontender, nondistended, but obese. PELVIC:  Normal-appearing external genitalia.  Normal-appearing vaginal mucosa and cervix.  No abnormal bleeding or discharge.  Bimanual exam was limited secondary to body habitus, but no appreciable masses were felt.  ASSESSMENT AND PLAN:  This is a 50 year old G2 P2 who is status post ThermaChoice endometrial ablation in December 2011 who is here for annual exam.  Pap smear was performed today.  The patient had her mammogram  this year.  The patient will be contacted with any abnormal results.  The patient is to return in a year p.r.n.  The patient was asked to keep track of her menstrual calendar in the event that her vaginal bleeding returns.          ______________________________ Catalina Antigua, MD    PC/MEDQ  D:  01/09/2011  T:  01/10/2011  Job:  [id number]

## 2011-01-21 NOTE — Assessment & Plan Note (Signed)
Margaret [name] is back regarding her cervical and low back pain.  She recently had  a fall out of her patio chair last week and had increased back and neck  pain.  She rates her pain a 9/10 today and describes it as sharp,  constant, and aching.  The pain interferes with general activity,  relationships with others, enjoyment of life at a 9/10 level.  Sleep is  poor.  She states that the MS Contin does not provide her significant  relief of her pain nor does the Percocet.  Soma helps somewhat with  spasms.  She had her mattress replaced on her bed with the new pillows,  and this has not changed her neck pain substantially.  She is still not  walking or doing much in the way of exercise (at least it is very  limited).   REVIEW OF SYSTEMS:  Notable for depression, anxiety, spasms, shortness  of breath.  Other pertinent positives listed above.   SOCIAL HISTORY:  Without significant change today.   PHYSICAL EXAMINATION:  VITAL SIGNS:  Blood pressure 123/72, pulse 106,  respiratory rate 18.  She is satting at 99% on room air.  GENERAL:  Patient is generally pleasant, alert and oriented x3.  NEUROMUSCULAR:  She walks without a cane today, a slightly wide-based  gait.  She had some mild tenderness in a general sense in the low back.  Cervical spine was tender along the bilateral trapeziae muscles, left  greater than right today.  She had some pain in the sternocleidomastoids  and rhomboids to a lesser extent.  Neck range is essentially 50 degrees  with forward flexion, 20 degrees with bending to the sides, and 10  degrees with extension.  Motor and sensory exam is normal in the upper  and lower extremities today.  She remains overweight.  HEART:  Regular rate and rhythm.  LUNGS:  Clear.  ABDOMEN:  Soft and nontender.   ASSESSMENT:  1. Cervical post laminectomy syndrome with associated myofascial pain.  2. Chronic low back pain related to lumbar disk disease and facet      arthropathy.  3.  Obesity.  4. Anxiety and depression.   PLAN:  1. Continue MS Contin.  Would increase to 30 mg q.12h.  2. Continue Percocet for breakthrough pain, 5 mg 1 q.12h. p.r.n., #60.  3. After informed consent, we injected three trigger points on each      shoulder, using 1% lidocaine.  Patient tolerated it well.  We      discussed appropriate exercise and stretching.  I will send her for      outpatient physical therapy to work on these issues.  She needed to      take a more active and responsible role in her healthcare and well-      being.  4. I will see her back in two months with a nurse clinic in one month.      Ranelle Oyster, M.D.  Electronically Signed     ZTS/MedQ  D:  02/05/2007 16:35:30  T:  02/05/2007 20:07:57  Job #:  [id number]

## 2011-01-21 NOTE — Procedures (Signed)
NAME:  Margaret [name], Margaret [name]                  ACCOUNT NO.:  [id number]   MEDICAL RECORD NO.:  [id number]          PATIENT TYPE:  OUT   LOCATION:  [facility]                 FACILITY[facility]  [facility]   PHYSICIAN:  Clinton D. Maple Hudson, MD, FCCP, FACPDATE OF BIRTH:  [birth year]10-1960   DATE OF STUDY:  03/15/2009                            NOCTURNAL POLYSOMNOGRAM   REFERRING PHYSICIAN:   REFERRING PHYSICIAN:  Osvaldo Shipper. Spruill, MD   INDICATIONS FOR STUDY:  Hypersomnia with sleep apnea.   EPWORTH SLEEPINESS SCORE:  Epworth sleepiness score  6/24, BMI 43.3.  Weight 260 pounds, height 65 inches.  Neck 14.5 inches.   HOME MEDICATIONS:  Charted and reviewed.   SLEEP ARCHITECTURE:  Total sleep time 353 minutes with sleep efficiency  82.2%.  Stage I was 8.1%, stage II 74.8%, stage III absent, REM 17.1% of  total sleep time.  Sleep latency 46 minutes, REM latency 198 minutes,  awake after sleep onset 31 minutes, arousal index 27.   BEDTIME MEDICATIONS:  Trazodone and Flexeril.   RESPIRATORY DATA:  Apnea/hypopnea index (AHI) 3.1 per hour.  A total of  18 events were scored including 6 obstructive apneas, 1 central apnea,  and 11 hypopneas.  Events were nonpositional.  REM AHI 13.9 per hour.  An additional 14 respiratory effort related arousals were counted for a  cumulative respiratory disturbance index (RDI) of 5.4 per hour.  There  were insufficient early events to permit CPAP titration by split  protocol on the study night.   OXYGEN DATA:  Technician heard no snoring.  Oxygen desaturated to a  nadir of 87% with mean oxygen saturation through the study of 96.3% on  room air.   CARDIAC DATA:  Normal sinus rhythm.   MOVEMENT/PARASOMNIA:  No significant movement disturbance.  No bathroom  trips.   IMPRESSION/RECOMMENDATIONS:  1. Sleep architecture was significant for frequent brief waking after      taking trazodone and Flexeril.  Percentage of time in REM was      mildly reduced.  2. Occasional respiratory  events with sleep disturbance, within normal      limits, AHI 3.1 per hour (normal range 0-5 per hour).  No snoring      heard by technician.  Minimal oxygen desaturation on room air to a      nadir of 87%.  3. The study does not support a diagnosis of medically significant      obstructive sleep apnea.  She has occasional      events, but attention to good sleep habits and treatment for      occasional insomnia may be helpful if clinically appropriate.      Clinton D. Maple Hudson, MD, [facility], FACP  Diplomate, Biomedical engineer of Sleep Medicine  Electronically Signed     CDY/MEDQ  D:  03/17/2009 16:10:96  T:  03/18/2009 01:33:19  Job:  [id number]

## 2011-01-24 NOTE — Assessment & Plan Note (Signed)
FOLLOWUP OFFICE NOTE   Margaret [name] is back regarding her cervical and low back pain.  She had been  doing fairly well with the Avinza and Percocet for her neck symptoms and  then ran out of the medications about a week or so ago.  She had missed  her appointment because of insurance problems.  Today she is back with  3/10 to 4/10 pain.  Pain is sharp, burning, dull, stabbing.  Patient has  tightness in the neck and shoulder regions.  Sleep is fair although she  usually wakes up in the morning with a lot of pain.  Pain increases with  walking, bending, sitting, and range of motion in the neck.   REVIEW OF SYSTEMS:  The patient reports spasms, confusion, depression,  anxiety.  She had some vision problems this morning, and she notes some  blurriness and watery eye.   SOCIAL HISTORY:  Is without significant change today.   PHYSICAL EXAMINATION:  VITAL SIGNS:  Blood pressure 144/82, pulse 89,  respiratory rate 16.  She is saturating 100% on room air.  GENERAL:  Patient is very pleasant, and alert and oriented x3.  She  walks without her cane today.  MUSCULOSKELETAL:  She has some tenderness in the low back and cervical  spine, with reduced range of motion today in general.  Low back exam is  similar to last visit.  Neck range of motion essentially is 40-50  degrees with forward flexion, 10 degrees extension, and 20 degrees  either side rotation.  Sensory exam is normal.  Reflexes are 2+.  HEART:  Regular rate and rhythm.  CHEST:  Clear.  ABDOMEN:  Soft, nontender.   ASSESSMENT:  1. Cervical postlaminectomy syndrome.  2. Chronic low back pain related to lumbar disk disease and facet      arthropathy.  3. Cervical and shoulder girdle myofascial pain.  4. Obesity.  5. Anxiety and depression.   PLAN:  1. Resume Avinza 30 mg daily and Percocet 5 one q.8 h. p.r.n.  Patient      will use Soma for muscle relaxant properties.  2. Continue with appropriate posture, diet and exercise.  She needs  to      examine her sleep situation and make sure her head is in a neutral      position while at rest.  3. I will see her back in about 2 months.  The patient is to see the      nurse clinic in 1 months' time.  Patient is hesitant to pursue any      type of injections or interventional treatment at this time still.      Ranelle Oyster, M.D.  Electronically Signed     ZTS/MedQ  D:  12/16/2006 12:56:01  T:  12/16/2006 13:31:39  Job #:  [id number]

## 2011-01-24 NOTE — Assessment & Plan Note (Signed)
Friday, September 04, 2006:   Margaret [name] is back regarding her chronic neck and low back pain.  She has been  doing better with the Opana 5 mg q.12 hours.  She is using less Percocet  now.  She is moving more around the house.  She is still limited with  her movement, range of motion.  She is not really able to exercise all  that much due to her discomfort.  She uses a cane for balance.  She  states that she can walk about 5-10 minutes without having to stop.  Trigger point injections were very helpful for her neck pain and it  essentially has resolved as of today.  The patient rates her pain right  now as a 5/10, describes it as sharp, intermittent, dull, stabbing.  Pain interferes with general activity, relationships with others and  enjoyment of life on a moderate level.  The pain increases with walking,  bending, sitting for prolonged periods of time.   REVIEW OF SYSTEMS:  The patient reports confusion, depression, anxiety,  tingling in the arms and dry mouth.  She denies any other  constitutional, GU, GI, cardiorespiratory complaints today.  Full review  is in the health and history section.   SOCIAL HISTORY:  Is without change.  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   PHYSICAL EXAM:  Blood pressure is 131/87, pulse is 97, respiratory rate  16, she is sating 99% on room air.  Patient is pleasant, in no acute  distress.  She alert and oriented x3.  Affect is generally bright and  appropriate.  She is walking without her cane today and was actually  moving much better.  Her low back was minimally tender to palpation.  She was able to flex to near 40 degrees today.  She extended to 15-20  degrees with some loss of balance.  Legs were 5/5 for strength.  Sensory  exam was normal.  Reflexes are 2+.  Neck range of motion was much  improved with minimal tenderness palpated throughout the trapezius,  sternocleidomastoid, splenius capitis, etc.  Spurling's test was  negative.  HEART:  Regular.  CHEST:  Clear.  ABDOMEN:  Soft, nontender.  Patient remains overweight.   ASSESSMENT:  1. Chronic cervicalgia and post laminectomy syndrome.  2. Chronic low back pain related to lumbar degenerative disk disease      and facet arthropathy.  3. Obesity.  4. Anxiety and depression.  5. Cervical/shoulder girdle myofascial pain related to #1, which has      since improved since injections.   PLAN:  1. Increase Opana ER to 10 mg q.12h.  2. Percocet 5/325 one q.8 hours p.r.n. #80.  3. Flexeril 10 mg q.8h. p.r.n. for spasm.  4. Encourage improved effort with exercise and diet.  5. Consider interventional procedures in the future.  6. I will see the patient back in about 2 months' time.  7. She will see the nurse clinic back next month.      Ranelle Oyster, M.D.  Electronically Signed     ZTS/MedQ  D:  09/04/2006 16:11:41  T:  09/04/2006 17:08:06  Job #:  [id number]   cc:   Hilda Lias, M.D.  Fax: [phone]

## 2011-01-24 NOTE — Assessment & Plan Note (Signed)
Friday, August 07, 2006:   Margaret [name] is back regarding her chronic neck and low back pain. Her neck pain  seems to have increased over the last few weeks. She does not identify  any particular cause. She may have been responsible for more work and  things around the house. Percocet helped somewhat for pain, but only  lasts an hour or two. The patient still remains hesitant to look at  steroid injections for her low back. We have done some trigger points in  the past with fair results. The patient describes her pain as stabbing,  sharp and constant. The pain is noted o be 7-9 out of 10 in severity.  Pain interferes with general activities, relations with others and  enjoyment of life on a moderate to severe level. Pain is most pronounced  today in the mid neck in the shoulder regions with tightness and  drawing, in addition to the pain being the biggest symptoms, sleep is  poor. Pain is persistent throughout the day. It does seem to be  exacerbated with any type of activity, particularly using the neck and  arms. The patient states that she can walk about five minutes without  having to stop. Pain is also noted in the low back over the iliac crest  region bilaterally.   REVIEW OF SYSTEMS:  Positive for numbness, tingling, trouble walking,  spasms, night sweats. She notes occasional drawing in her left hand,  which was present prior to her neck surgery.   SOCIAL HISTORY:  Without significant change other than those issues  mentioned above.   PHYSICAL EXAMINATION:  Blood pressure 149/84, pulse is 95, respiratory  rate 16, she is satting 99% on room air. The patient is pleasant, in a  bit of distress, and anxious. She walks with a limp and uses her cane  today. The patient's posture seems to be notable for a head forward  positioning and rotation internally of the shoulders. She has pain with  deep palpation of the cervical paraspinals, particularly the upper  trapezius, perhaps the  semispinalis muscle as well bilaterally. Mid  trapezius bellies are very tight and tender to palpation today.  Spurling's test was negative. She did have generalized tenderness with  all cervical movement today. Low back was notable for pain in the lumbar  facets and paraspinals. Shoulder range of motion was fair. No motor  abnormalities were appreciated in the upper extremities today. Reflexes  appear to be 2+. Sensation was normal. Heart was regular. Chest was  clear. Abdomen: Soft, nontender. The patient remains overweight.   ASSESSMENT:  1. Chronic cervicalgia and postlaminectomy syndrome.  2. Chronic low back pain associated with lumbar degenerative disc      disease and facet arthropathy.  3. Obesity.  4. Anxiety and depression.  5. Myofascial pain associated with #1.   PLAN:  1. After I had informed consent, we injected four trigger points in      the upper and mid trapezius bilaterally, with two mL of 1%      lidocaine. The patient tolerated well.  2. Will add Opana extended-release 5 mg q.12 h. She uses Percocet      5/325 one q.8 h. p.r.n. for breakthrough pain, #60 and 75 were      dispensed respectively.  3. We will use Flexeril 10 mg q.8 for spasms.  4. Encouraged regular stretching, ice, heat, and other modalities, as      a lot of her pain today appears to be  muscular in nature.  5. Consider interventional procedures (i.e. injections), patient      permitting in the future.  6. Continue Neurontin for lower extremity smooth symptomatology. This      seems to have helped somewhat. Continue with 300 mg t.i.d.  7. Continue Mobic.  8. I will see her back in one month's time.      Ranelle Oyster, M.D.  Electronically Signed     ZTS/MedQ  D:  08/07/2006 09:29:58  T:  08/07/2006 11:27:56  Job #:  [id number]   cc:   Margaret [name], M.D.  Fax: [phone]

## 2011-01-24 NOTE — Op Note (Signed)
NAMESANDRIA, Margaret [name]                  ACCOUNT NO.:  [id number]   MEDICAL RECORD NO.:  [id number]          PATIENT TYPE:  OIB   LOCATION:  3172                         FACILITY:  [facility]   PHYSICIAN:  Hilda Lias, M.D.   DATE OF BIRTH:  12/27/1960   DATE OF PROCEDURE:  02/17/2006  DATE OF DISCHARGE:                                 OPERATIVE REPORT   PREOPERATIVE DIAGNOSIS:  C4, C5 herniated disk.   POSTOPERATIVE DIAGNOSIS:  C4, C5 herniated disk.   PROCEDURE:  1.  Anterior 4-5 diskectomy.  2.  Interbody fusion with allograft and autograft plate.  __________.   SURGEON:  Hilda Lias, M.D.   ASSISTANT:  Hewitt Shorts, M.D.   CLINICAL HISTORY:  The patient came to my office complaining of neck pain  with weakening of the deltoid.  Patient had failed with courses of the  treatment.  X-rays showed that she has a herniated disk __________.  Surgery  was advised.  The risk was explained in the History and Physical.   PROCEDURE:  The patient was taken to the OR and after intubation the neck  was prepped with DuraPrep.  A transverse incision was made through the skin,  subcutaneous tissue, platysma, down to cervical spine.  X-ray showed that we  were at the L4-5.  The anterior ligament was entered.  We brought the  microscope and total diskectomy was accomplished.  Using the 1 and 2 mm  Kerrison punch then we opened the posterior ligament.  We found quite a bit  of spondylosis with a herniated disk bilaterally, left worse than the right  side.  Decompression of the thecal sac as well as both C5 levels was  accomplished.  The patient had quite a bit of epidural pain which gave [place]  quite a bit of bleeding, but at the end we have a good decompression of both  C5 nerve roots.  Having done this, the endplates were drained.  An  allograft, 7 mm high, lordotic with allograft inside, was inserted followed  by a plate using the four screws.  Lateral C-spine showed good position of  the  graft and the plate.  From thereon, the area was irrigated.  We waited 5  minutes just to be sure there was not any bleeding.  After we found out that  there was no bleeding, the area was irrigated one more time and the wound  was closed with Vicryl and Steri-Strips.           ______________________________  Hilda Lias, M.D.     EB/MEDQ  D:  02/17/2006  T:  02/17/2006  Job:  [id number]

## 2011-01-24 NOTE — Assessment & Plan Note (Signed)
Margaret [name] is back regarding her chronic pain. She states that she had a  reaction to both the Mobic and the Opana. She a lot of swelling and  weight gain as well as rash and tongue edema etc. She has been off the  Opana for about 3 weeks now. She has been off the Mobic a bit longer.  She seems to think her weight stabilized a little bit but still waxes  and wanes. Patient states that her pain is doing a bit better overall.  It averages currently a 7 out of 10 without the Opana. Patient still has  her moments where it may peak to a 9. Pain is usually related to  activity. She states that pain interferes with general activity,  relations with others, and enjoyment of life on a moderate level.   REVIEW OF SYSTEMS:  Patient reports the above plus fever, night sweats,  and coughing. Full review is in the health and history section.   SOCIAL HISTORY:  Without change.   PHYSICAL EXAMINATION:  Blood pressure is 137/82, pulse is 88,  respiratory rate 16, she is sating 100% in room air. Patient is pleasant  no acute distress. She is alert and oriented x3. Affect is bright and  appropriate. She continues to walk without her cane. She remains  somewhat tender in the lower back but stable. She is able to flex to  about 40 degrees to 45 degrees today. Extension was about 15 degrees.  Strength was 5/5.  SENSORY EXAM: Normal. Reflexes are 2+.  HEART: Regular.  CHEST: Clear.  ABDOMEN: Soft, nontender. She remains obese.   ASSESSMENT:  1. Chronic cervicalgia post laminectomy syndrome.  2. Chronic low back related to lumbar degenerative disc disease and      facet arthropathy.  3. Obesity.  4. Anxiety and depression.  5. Cervical/shoulder griddle myofascial pain which has improved.   PLAN:  1. Begin trial of Avinza 30 mg q. day.  2. Refill Percocet 5, one q. 8 hours p.r.n. #60.  3. Will add soma as a muscle relaxant to replace Flexeril.  4. Continue with diet and exercise measures.  5. I will see  the patient back in about 1 months time.      Ranelle Oyster, M.D.  Electronically Signed     ZTS/MedQ  D:  10/28/2006 13:19:48  T:  10/28/2006 16:39:44  Job #:  [id number]

## 2011-01-24 NOTE — H&P (Signed)
[name], [name]                  ACCOUNT NO.:  [id number]   MEDICAL RECORD NO.:  [id number]          PATIENT TYPE:  AMB   LOCATION:  SDS                          FACILITY:  [facility]   PHYSICIAN:  Hilda Lias, M.D.   DATE OF BIRTH:  08-27-1961   DATE OF ADMISSION:  02/17/2006  DATE OF DISCHARGE:                                HISTORY & PHYSICAL   [name] is a lady who was seen by me because of neck pain with radiation  to the right shoulder.  This problem had been going on for several months  and she had been seen by orthopedic surgeon and neurologist.  Patient had  MRI.  By the time she came to see me about 10 weeks ago she was not any  better.  The pain was going to the right upper extremity associated with  numbness and tingling sensation.  Patient has x-rays.  She failed with  conservative treatment and because of the findings she wants to proceed with  surgery.   PAST MEDICAL HISTORY:  Cesarean section.  She is allergic to PENICILLIN.   SOCIAL HISTORY:  Negative.   FAMILY HISTORY:  Negative.   REVIEW OF SYSTEMS:  Positive anxiety and depression.  Shortness of breath.   Patient came to see me in my office on two or three occasions.  She  complained of neck pain with radiation to the right shoulder.  Head, ears,  nose, and throat normal.  Neck:  She has some decrease in flexibility.  Lungs clear.  Heart sounds normal.  Abdomen normal.  Extremity:  It was  difficult to assess because the pain she was having but she had some mild  weakening of the biceps and the deltoid.  Reflexes normal.  The x-rays  showed __________ and the MRI showed herniated disk at the level of C4-C5  going to the left side and to the right side.  The MRI showed that she has  quite a bit of herniated disk with displacement of the thecal sac center and  to the left.   CLINICAL IMPRESSION:  C4-C5 herniated disk.   RECOMMENDATIONS:  The patient is being admitted for surgery.  The surgery  will be  anterior 4-5 diskectomy.  She knows about the risks such as no  improvement, bleeding, damage to the vocal cords, damage to the esophagus,  need for further surgery, paralysis.           ______________________________  Hilda Lias, M.D.     EB/MEDQ  D:  02/17/2006  T:  02/17/2006  Job:  [id number]

## 2011-01-24 NOTE — Assessment & Plan Note (Signed)
Margaret [name] is back regarding her neck and lumbar pain. I saw her approximately 6  weeks ago. We initiated Mobic and Lidoderm patches and began therapy. She  feels that therapy has helped with her range of motion and stretching a bit  although she still feels a significant tightness, particularly in the neck  and shoulders. She had limited benefit from Lidoderm patches and they also  irritated her skin. She did note that the Mobic was overly helpful. She  rates her pain at a 9 out of 10 today. She describes a sharp burning,  stabbing, tingling and aching. Sleep has been poor. She is limited with her  activity. She is able to walk about 10 minutes at a time without having to  stop. She does try to do her stretches on a regular basis as she was shown  by therapy. I have not received a discharge therapy report as of yet to  ascertain the level of her progress with them.  The pain generally increases  with walking, sitting and standing for prolonged periods of time.   REVIEW OF SYSTEMS:  The patient reports numbness, tingling, spasms,  depression, anxiety. Other pertinent positives as well as full review is in  the outpatient history section of the chart.   SOCIAL HISTORY:  Without change. Patient continues out of work. She was  previously working as a Engineer, civil (consulting) and has been out of work  due to her pain.   PHYSICAL EXAMINATION:  VITAL SIGNS: Blood pressure is 143/82, pulse is 106,  respiratory rate 16, she is saturating 99% in room air.  GENERAL: The patient is generally alert, a bit flat but in no acute distress  today. She difficulty arising from the seated to standing position. She is  limited with flexion and extension although extension seemed to cause more  pain, both in the low back and cervical spine today. She had tenderness over  the L5 through S1 area bilaterally, right equal to left.  Rolene Course testing was  equivocal. Thumb test was equivocal. Facet maneuvers were  equivocal and  positive in the low back. Facet maneuvers in the neck seemed to be more  provocative. She did have significant muscle spasm in the trapezius at two  different levels, both superiorly and in the medial bellie on either side.  sternocleidomastoid's were less tender today. Shoulder range of motion was  fair.  Motor function was 5 out of 5 in all four extremities except for some  pain inhibition proximally. Sensation was normal. Reflexes were  2+.  HEART: Regular rhythm but increased rate.  CHEST: Clear.  ABDOMEN: Soft, nontender. The patient remains significantly overweight.   ASSESSMENT:  1. Chronic low back pain with documented history of C4, C5 herniated disks      status post ACDF in June. Additionally patient has an MRI dated  April 23, 2006 which reveals a degenerative disk at L5-S1 with left  paracentral annular tear without evidence of nerve root impingement.  Multiple facet arthropathies are noted on imaging as well, both in the neck  and back.  1. Obesity.  2. History of anxiety and depression.   PLAN:  The patient needs to continue to working on stretching and range of  motion. I need to review her therapy report from last month to see if there  is anything else we need to work on specifically.  I think the patient needs  to be more dedicated with her range of motion and exercising, as  well as  working on her diet.   I think the patient could benefit from epidural steroid injections,  particularly in the lumbar spine but she is hesitant to perform these due to  intolerance of steroids. Facet injections could also be beneficial.   After informed consent we injected 4 trigger points in the trapezius muscles  bilaterally, using 2cc of 1% lidocaine. The patient tolerated this well.  She had a muscle twitch response in at least 2 out of  the 4 trigger points  today. She needs to be aggressive with stretching and range of motion there.   Begin trial of Neurontin  for leg symptoms and difficulty with sleep. Begin  300 mg q.h.s. and increase to t.i.d. over about a week's time.  Percocet one q.12 hours p.r.n. #60 for breakthrough pain.  Continue Mobic daily.  Gave the patient a trial of Fexmid 7.5 mg daily p.r.n. for back spasm.   I will see the patient back in about 1 month's time.      Ranelle Oyster, M.D.  Electronically Signed     ZTS/MedQ  D:  07/15/2006 13:16:33  T:  07/15/2006 21:26:39  Job #:  [id number]   cc:   Hilda Lias, M.D.  Fax: [phone]

## 2011-04-17 ENCOUNTER — Inpatient Hospital Stay (HOSPITAL_COMMUNITY): Payer: Medicare Other

## 2011-04-17 ENCOUNTER — Encounter (HOSPITAL_COMMUNITY): Payer: Self-pay | Admitting: *Deleted

## 2011-04-17 ENCOUNTER — Inpatient Hospital Stay (HOSPITAL_COMMUNITY)
Admission: AD | Admit: 2011-04-17 | Discharge: 2011-04-17 | Disposition: A | Discharge status: Home / Self Care | Payer: Medicare Other | Source: Ambulatory Visit | Attending: Obstetrics & Gynecology | Admitting: Obstetrics & Gynecology

## 2011-04-17 DIAGNOSIS — N925 Other specified irregular menstruation: Secondary | ICD-10-CM

## 2011-04-17 DIAGNOSIS — N949 Unspecified condition associated with female genital organs and menstrual cycle: Secondary | ICD-10-CM

## 2011-04-17 DIAGNOSIS — N938 Other specified abnormal uterine and vaginal bleeding: Secondary | ICD-10-CM | POA: Insufficient documentation

## 2011-04-17 DIAGNOSIS — D219 Benign neoplasm of connective and other soft tissue, unspecified: Secondary | ICD-10-CM

## 2011-04-17 DIAGNOSIS — D649 Anemia, unspecified: Secondary | ICD-10-CM

## 2011-04-17 DIAGNOSIS — D259 Leiomyoma of uterus, unspecified: Secondary | ICD-10-CM

## 2011-04-17 DIAGNOSIS — N946 Dysmenorrhea, unspecified: Secondary | ICD-10-CM | POA: Insufficient documentation

## 2011-04-17 HISTORY — DX: Essential (primary) hypertension: I10

## 2011-04-17 HISTORY — DX: Anxiety disorder, unspecified: F41.9

## 2011-04-17 LAB — CBC
MCV: 78.8 fL (ref 78.0–100.0)
Platelets: 364 10*3/uL (ref 150–400)
RBC: 3.82 MIL/uL — ABNORMAL LOW (ref 3.87–5.11)
RDW: 16.3 % — ABNORMAL HIGH (ref 11.5–15.5)
WBC: 10.2 10*3/uL (ref 4.0–10.5)

## 2011-04-17 MED ORDER — FERROUS SULFATE 325 (65 FE) MG PO TABS: 325.0000 mg | ORAL_TABLET | Freq: Every day | ORAL | Status: DC

## 2011-04-17 MED ORDER — MEDROXYPROGESTERONE ACETATE 10 MG PO TABS: 10.0000 mg | ORAL_TABLET | Freq: Every day | ORAL | Status: DC

## 2011-04-17 NOTE — Progress Notes (Signed)
H.Neese,NP and E.Rice at bedside to evaluate pt bleeding from cervix. Large baseball sized clot removed. Consult with Dr. Debroah Loop ordered.

## 2011-04-17 NOTE — ED Provider Notes (Signed)
History     CSN: [id number] Arrival date & time: 04/17/2011 12:48 PM  Chief Complaint  Patient presents with  . Vaginal Bleeding  . Dysmenorrhea   HPI Margaret [name] is a 50 y.o. obese AA female who presents to [facility]U with vaginal bleeding and feeling weak and dizzy. She had an ablation in December and when she went back for her 6 wk checkup she was still bleeding but stopped in March. In April she had 5 days of bleeding and then none until July when the bleeding started again and has not stopped. The past 4 days it has been heavy with clots and she complains of feeling weak and dizzy and having abdominal pain.    Past Medical History  Diagnosis Date  . Hypertension   . Anxiety     Past Surgical History  Procedure Date  . Cesarean section     x2  . Neck surgery     Family History  Problem Relation Age of Onset  . Hypertension Mother   . Hypertension Maternal Grandmother   . Heart disease Maternal Grandmother     History  Substance Use Topics  . Smoking status: Never Smoker   . Smokeless tobacco: Not on file  . Alcohol Use: Yes     1 glss every 2-3 months    OB History    Grav Para Term Preterm Abortions TAB SAB Ect Mult Living   2 2 2  0 0 0 0 0 0 2      Review of Systems  Constitutional: Positive for chills and fatigue. Negative for fever and diaphoresis.  HENT: Negative for ear pain, congestion, sore throat, facial swelling, neck pain, neck stiffness, dental problem and sinus pressure.   Eyes: Positive for visual disturbance. Negative for photophobia, pain and discharge.  Respiratory: Negative for cough, chest tightness and wheezing.   Cardiovascular: Negative for chest pain.  Gastrointestinal: Positive for abdominal pain. Negative for nausea, vomiting, diarrhea, constipation and abdominal distention.  Genitourinary: Negative for dysuria, frequency, flank pain and difficulty urinating.  Musculoskeletal: Positive for back pain. Negative for myalgias and gait problem.    Skin: Negative for rash.  Neurological: Positive for dizziness, weakness, light-headedness and headaches. Negative for speech difficulty and numbness.  Psychiatric/Behavioral:       Anxiety and depression.    Physical Exam  BP 158/113  Pulse 106  Temp(Src) 98.1 F (36.7 C) (Oral)  Resp 20  Ht 5\' 5"  (1.651 m)  Wt 265 lb (120.203 kg)  BMI 44.10 kg/m2  Physical Exam  Constitutional: She is oriented to person, place, and time. She appears distressed.       Obese  HENT:  Head: Normocephalic.  Neck: Neck supple.  Pulmonary/Chest: Effort normal.  Abdominal: Soft. There is tenderness. There is no rebound and no guarding.         Tenderness in the lower abdomen with palpation.  Genitourinary:       The cervix is open with large clot. Clot removed with ring forceps. Pt. Continues to have moderate bleeding from the cervical os.  Unable to palpate the size of the uterus due to patient habitus.  Neurological: She is alert and oriented to person, place, and time.  Skin: Skin is warm and dry.    ED Course Results for orders placed during the hospital encounter of 04/17/11 (from the past 24 hour(s))  CBC     Status: Abnormal   Collection Time   04/17/11  1:28 PM  Component Value Range   WBC 10.2  4.0 - 10.5 (K/uL)   RBC 3.82 (*) 3.87 - 5.11 (MIL/uL)   Hemoglobin 9.2 (*) 12.0 - 15.0 (g/dL)   HCT 56.2 (*) 13.0 - 46.0 (%)   MCV 78.8  78.0 - 100.0 (fL)   MCH 24.1 (*) 26.0 - 34.0 (pg)   MCHC 30.6  30.0 - 36.0 (g/dL)   RDW 86.5 (*) 78.4 - 15.5 (%)   Platelets 364  150 - 400 (K/uL)    Procedures :  US Pelvis Complete   Status: Final result           Study Result     *RADIOLOGY REPORT*   Clinical Data: Pelvic pain, fibroids   TRANSABDOMINAL AND TRANSVAGINAL ULTRASOUND OF PELVIS Technique:  Both transabdominal and transvaginal ultrasound examinations of the pelvis were performed. Transabdominal technique was performed for global imaging of the pelvis including  uterus, ovaries, adnexal regions, and pelvic cul-de-sac.   Comparison: April 07, 2010    It was necessary to proceed with endovaginal exam following the transabdominal exam to visualize the the endometrium.   Findings: The uterus is enlarged and lobular in contour, measuring 14.8 x 5.9 x 6.3 cm (previously 10.4 x 4.9 x 4.6 cm).  Again noted is a right fundal myometrial fibroid that measures 6.1 cm (previously 5.1 cm.) There is a left fundal myometrial fibroid that measures 3.2 cm (previously 2.9 cm).  The endometrial stripe is homogeneous but slightly thickened, measuring 16 mm.   Both ovaries have a normal size and appearance.  The right ovary measures 4.4 x 2.3 x 2.5 cm, and the left ovary measures 3.5 x 2.4 x 2.7 cm.  A small simple hydrosalpinx is seen in the left.  There are no adnexal masses or free pelvic fluid.   IMPRESSION: Interval enlargement of uterine fibroids, as described above.   Homogeneous but slightly thickened endometrium.   Small left simple hydrosalpinx.   Original Report Authenticated By: Brandon Melnick, M.D.     Imaging     US Pelvis Complete (Order #[id number]) on 04/17/2011 - Imaging Information      Imaging     Imaging Information       Signed by       Signed Date/Time   Phone Pager    Dorthula Nettles 04/17/2011  4:50 PM EDT [phone]      Exam Information       Status Exam Begun   Exam Ended      Final [99] 04/17/2011  3:13 PM EDT 04/17/2011  4:34 PM EDT        PACS Images     Show images for US Pelvis Complete      External Result Report     External Result Report      Encounter     View Encounter             US Pelvis Complete (Order [id number])  Imaging  : [id number]   Authorizing: Scheryl Darter, MD  Department: [facility]   Date: 04/17/2011  Released By: Kerrie Buffalo, NP        Order Information       Order Date/Time Release Date/Time Start Date/Time End Date/Time    04/17/2011  2:05 PM 04/17/2011   2:05 PM 04/17/2011  2:05 PM 04/17/2011  2:05 PM              Order Details       Frequency Duration Priority Order Class  1 time imaging 1  occurrence STAT Hospital Performed              Imaging CC Recipients           Collection Information       Resulting Agency    [facility]               Order Provider Info         Office phone Pager/beeper E-mail    Ordering User [place], [place] [phone] -- --    Authorizing Provider Scheryl Darter, MD [phone] -- --    Attending Provider Scheryl Darter, MD [phone] -- --    Billing Provider Dorthula Nettles [phone] --      Order-Level Documents:     There are no order-level documents.      Reprint Requisition     US Pelvis Complete ([id number]) on 04/17/11       MDM Consult with Dr. Debroah Loop.  Assessment: Uterine fibroids                        Dis functional uterine bleeding                        Anemia  Plan:    Provera 10 mg. Po daily # 30             Iron tablets 325 mg. Daily             Follow up in GYN Clinic in 2 weeks.       [place], [place] 04/17/11 1743

## 2011-04-17 NOTE — Progress Notes (Signed)
Pt reports having an Ablation in December. Started having heavy bleeding and cramping that started July 22. Reports changing pad 3 times in 1 HR. Pt feels light headed and dizzy. Been bad for the past 5 days. With large clots.

## 2011-05-05 ENCOUNTER — Inpatient Hospital Stay (HOSPITAL_COMMUNITY)
Admission: AD | Admit: 2011-05-05 | Discharge: 2011-05-06 | Disposition: A | Discharge status: Home / Self Care | Payer: Medicare Other | Source: Ambulatory Visit | Attending: Obstetrics and Gynecology | Admitting: Obstetrics and Gynecology

## 2011-05-05 ENCOUNTER — Encounter (HOSPITAL_COMMUNITY): Payer: Self-pay | Admitting: *Deleted

## 2011-05-05 DIAGNOSIS — D259 Leiomyoma of uterus, unspecified: Secondary | ICD-10-CM

## 2011-05-05 DIAGNOSIS — N938 Other specified abnormal uterine and vaginal bleeding: Secondary | ICD-10-CM | POA: Insufficient documentation

## 2011-05-05 DIAGNOSIS — F411 Generalized anxiety disorder: Secondary | ICD-10-CM | POA: Insufficient documentation

## 2011-05-05 DIAGNOSIS — D500 Iron deficiency anemia secondary to blood loss (chronic): Secondary | ICD-10-CM | POA: Insufficient documentation

## 2011-05-05 DIAGNOSIS — E669 Obesity, unspecified: Secondary | ICD-10-CM | POA: Insufficient documentation

## 2011-05-05 DIAGNOSIS — N925 Other specified irregular menstruation: Secondary | ICD-10-CM | POA: Insufficient documentation

## 2011-05-05 DIAGNOSIS — N939 Abnormal uterine and vaginal bleeding, unspecified: Secondary | ICD-10-CM

## 2011-05-05 DIAGNOSIS — N949 Unspecified condition associated with female genital organs and menstrual cycle: Secondary | ICD-10-CM | POA: Insufficient documentation

## 2011-05-05 DIAGNOSIS — D649 Anemia, unspecified: Secondary | ICD-10-CM

## 2011-05-05 DIAGNOSIS — I10 Essential (primary) hypertension: Secondary | ICD-10-CM | POA: Insufficient documentation

## 2011-05-05 DIAGNOSIS — D219 Benign neoplasm of connective and other soft tissue, unspecified: Secondary | ICD-10-CM

## 2011-05-05 DIAGNOSIS — Z79899 Other long term (current) drug therapy: Secondary | ICD-10-CM | POA: Insufficient documentation

## 2011-05-05 DIAGNOSIS — N7013 Chronic salpingitis and oophoritis: Secondary | ICD-10-CM | POA: Insufficient documentation

## 2011-05-05 NOTE — Progress Notes (Signed)
NOT IN LOBBY 

## 2011-05-05 NOTE — Progress Notes (Signed)
Pt states that was here on 04/17/2011 with vaginal bleeding-was DX with fibroids and given a med to stop the bleeding and it stopped  Until tthis past Friday-pt states she has bad cramping and feeling of pelvic cramping and nausea

## 2011-05-05 NOTE — Progress Notes (Signed)
PT SAYS WAS HERE   ON 04-17-2011 FOR BLEEDING. DID U/S - SAW FIBROIDS- GAVE MEDS- BLEEDING STOPPED- BUT ON FRIDA  THE 05-02-2011 BLEEDING STARTED AGAIN . CRAMPING  STARTED YESTERDAY- TOOK 800MG  IBUPROFEN.   IN TRIAGE- DIAPER-  DARK RED SMEAR-.   HAD BTL IN 1989.

## 2011-05-06 LAB — CBC
Hemoglobin: 8.9 g/dL — ABNORMAL LOW (ref 12.0–15.0)
MCH: 24.1 pg — ABNORMAL LOW (ref 26.0–34.0)
Platelets: 407 10*3/uL — ABNORMAL HIGH (ref 150–400)
RBC: 3.7 MIL/uL — ABNORMAL LOW (ref 3.87–5.11)
WBC: 9.1 10*3/uL (ref 4.0–10.5)

## 2011-05-06 MED ORDER — MEDROXYPROGESTERONE ACETATE 150 MG/ML IM SUSP
150.0000 mg | Freq: Once | INTRAMUSCULAR | Status: AC
  Administered 2011-05-06: 150 mg via INTRAMUSCULAR
  Filled 2011-05-06: qty 1

## 2011-05-06 MED ORDER — OXYCODONE-ACETAMINOPHEN 5-325 MG PO TABS
1.0000 | ORAL_TABLET | ORAL | Status: DC | PRN
  Administered 2011-05-06: 1 via ORAL
  Filled 2011-05-06: qty 1

## 2011-05-06 MED ORDER — IBUPROFEN 600 MG PO TABS: 600.0000 mg | ORAL_TABLET | Freq: Four times a day (QID) | ORAL | Status: AC | PRN

## 2011-05-06 MED ORDER — MEDROXYPROGESTERONE ACETATE 10 MG PO TABS: 20.0000 mg | ORAL_TABLET | Freq: Every day | ORAL | Status: DC

## 2011-05-06 NOTE — ED Provider Notes (Signed)
Margaret [name] is a 50 y.o. [phone]  Chief Complaint  Patient presents with  . Vaginal Bleeding   SUBJECTIVE:  Vaginal Bleeding The patient's primary symptoms include vaginal bleeding. This is a recurrent problem. The current episode started in the past 7 days. The problem occurs daily. The problem has been gradually worsening. The pain is mild. The problem affects both sides. She is not pregnant. Associated symptoms include abdominal pain, back pain, dysuria, headaches and nausea. Pertinent negatives include no diarrhea, fever, flank pain or frequency.   She underwent ablation last Dec. But continued to bleed until March and another episode in April for 5 days. Bleeding recurred in July and continued until 04/27/2011 and she was seen here in [facility]U. Her hemoglobin was 9.2. Her ultrasound showed an enlarged uterus with right fundal fibroid 6.1 cm left fundal fibroid 3.2 cm, small simple hydrosalpinx, and endometrial stripe of 16 mm. She was treated with Provera 10 mg a day and was given an appointment in GYN clinic for 05/15/2011 the Provera was initially effective however she resumed bleeding 4 days ago and describes it as heavy with clots. It is associated with lower abdominal cramping, some dizziness, and pelvic pressure. Ibuprofen 800 mg taken at 2 PM today did not alleviate her pain. She is taking her iron as directed. Past Medical History  Diagnosis Date  . Hypertension   . Anxiety     Past Surgical History  Procedure Date  . Cesarean section     x2  . Neck surgery    No current facility-administered medications on file prior to encounter.   Current Outpatient Prescriptions on File Prior to Encounter  Medication Sig Dispense Refill  . clonazePAM (KLONOPIN) 0.5 MG tablet Take 1 mg by mouth 2 (two) times daily as needed. As needed for anxiety        . cyclobenzaprine (FLEXERIL) 10 MG tablet Take 10 mg by mouth 3 (three) times daily.        . DULoxetine (CYMBALTA) 60 MG capsule Take 60  mg by mouth daily.        . ferrous sulfate 325 (65 FE) MG tablet Take 1 tablet (325 mg total) by mouth daily.  30 tablet  0  . fluticasone (VERAMYST) 27.5 MCG/SPRAY nasal spray Place 1 spray into the nose daily. 1 spray in each nostril        . ibuprofen (ADVIL,MOTRIN) 200 MG tablet Take 200 mg by mouth 3 (three) times daily.        . medroxyPROGESTERone (PROVERA) 10 MG tablet Take 1 tablet (10 mg total) by mouth daily.  30 tablet  0  . simvastatin (ZOCOR) 40 MG tablet Take 40 mg by mouth at bedtime.        . valsartan (DIOVAN) 160 MG tablet Take 160 mg by mouth daily.        . TraZODone & Diet Manage Prod (TRAZAMINE) 50 MG MISC Take 50 mg by mouth at bedtime as needed.          OBJECTIVE: Filed Vitals:   05/05/11 2002  BP: 132/87  Pulse: 107  Temp: 98.7 F (37.1 C)  Resp: 20   Gen. NAD  Abdomen: Obese, soft, mildly tender to palpation suprapubically . Pelvic: Spec: mod blood with small clots removed from cx. No CMT  Biman: Uterus firm, enlarged, difficult to size due to body habitus Results for orders placed during the hospital encounter of 05/05/11 (from the past 24 hour(s))  CBC  Status: Abnormal   Collection Time   05/05/11 11:58 PM      Component Value Range   WBC 9.1  4.0 - 10.5 (K/uL)   RBC 3.70 (*) 3.87 - 5.11 (MIL/uL)   Hemoglobin 8.9 (*) 12.0 - 15.0 (g/dL)   HCT 16.1 (*) 09.6 - 46.0 (%)   MCV 78.9  78.0 - 100.0 (fL)   MCH 24.1 (*) 26.0 - 34.0 (pg)   MCHC 30.5  30.0 - 36.0 (g/dL)   RDW 04.5 (*) 40.9 - 15.5 (%)   Platelets 407 (*) 150 - 400 (K/uL)  ASSESSMENT:  AUB with myomatous uterus, Fe defic anemia from CBL, CHTN, Obesity  PLAN: D/W Dr Jolayne Panther: will up the Provera to 20 mg per day until her appointment 89/7/12. Depo given. Continue iron.

## 2011-05-08 NOTE — ED Provider Notes (Signed)
Agree with above note.  CONSTANT,PEGGY 05/08/2011 10:59 AM   

## 2011-05-15 ENCOUNTER — Ambulatory Visit (INDEPENDENT_AMBULATORY_CARE_PROVIDER_SITE_OTHER): Payer: Medicare Other | Admitting: Obstetrics and Gynecology

## 2011-05-15 ENCOUNTER — Encounter: Payer: Self-pay | Admitting: Obstetrics and Gynecology

## 2011-05-15 VITALS — BP 133/82 | HR 99 | Temp 98.9°F | Ht 65.0 in | Wt 262.7 lb

## 2011-05-15 DIAGNOSIS — N925 Other specified irregular menstruation: Secondary | ICD-10-CM

## 2011-05-15 DIAGNOSIS — N938 Other specified abnormal uterine and vaginal bleeding: Secondary | ICD-10-CM

## 2011-05-15 DIAGNOSIS — N949 Unspecified condition associated with female genital organs and menstrual cycle: Secondary | ICD-10-CM

## 2011-05-15 LAB — POCT PREGNANCY, URINE: Preg Test, Ur: NEGATIVE

## 2011-05-15 NOTE — Progress Notes (Signed)
This patient is a 50 year old gravida 2 para 16 African American female. She underwent a D&C hysteroscopy and endometrial ablation for management of dysfunctional uterine bleeding in December of 2011. Since to July of this year she's been into the emergency room twice and has been bleeding since early July. She was first treated with Provera by mouth second time added Depo-Provera which did not help the bleeding. She's been on iron and had a hemoglobin of 8. This lady has tried hormonal therapy endometrial ablation with no success. I think it is time that she sees one of our surgeons in order to schedule hysterectomy. I see no reason to do an endometrial biopsy she had a D&C in December and endometrial by biopsy prior to that. Her second visit to the emergency room she had an ultrasound which showed that her fibroids had enlarged somewhat from the previous ultrasound done 1 year ago.  Impression: Intractable dysfunctional uterine bleeding.

## 2011-06-20 ENCOUNTER — Encounter: Payer: Self-pay | Admitting: Obstetrics & Gynecology

## 2011-06-20 ENCOUNTER — Ambulatory Visit (INDEPENDENT_AMBULATORY_CARE_PROVIDER_SITE_OTHER): Payer: Medicare Other | Admitting: Obstetrics & Gynecology

## 2011-06-20 VITALS — BP 142/81 | HR 99 | Temp 98.3°F | Ht 65.0 in | Wt 254.9 lb

## 2011-06-20 DIAGNOSIS — D219 Benign neoplasm of connective and other soft tissue, unspecified: Secondary | ICD-10-CM

## 2011-06-20 DIAGNOSIS — D259 Leiomyoma of uterus, unspecified: Secondary | ICD-10-CM

## 2011-06-20 DIAGNOSIS — G43909 Migraine, unspecified, not intractable, without status migrainosus: Secondary | ICD-10-CM | POA: Insufficient documentation

## 2011-06-20 LAB — DIFFERENTIAL
Eosinophils Relative: 0
Lymphocytes Relative: 18
Lymphs Abs: 1.2
Monocytes Absolute: 0.6
Monocytes Relative: 9
Neutro Abs: 4.9

## 2011-06-20 LAB — CBC
HCT: 34.1 — ABNORMAL LOW
Hemoglobin: 10.9 — ABNORMAL LOW
RBC: 4.69
RDW: 18.9 — ABNORMAL HIGH
WBC: 6.7

## 2011-06-20 LAB — I-STAT 8, (EC8 V) (CONVERTED LAB)
Acid-Base Excess: 1
Bicarbonate: 26.7 — ABNORMAL HIGH
Glucose, Bld: 96
Hemoglobin: 13.3
Operator id: 198171
Potassium: 3.3 — ABNORMAL LOW
Sodium: 140
TCO2: 28

## 2011-06-20 LAB — SAMPLE TO BLOOD BANK

## 2011-06-20 LAB — APTT: aPTT: 27

## 2011-06-20 MED ORDER — LEUPROLIDE ACETATE 3.75 MG IM KIT: 3.7500 mg | PACK | Freq: Once | INTRAMUSCULAR | Status: DC

## 2011-06-20 MED ORDER — LEUPROLIDE ACETATE 3.75 MG IM KIT
3.7500 mg | PACK | Freq: Once | INTRAMUSCULAR | Status: AC
  Administered 2011-06-20: 3.75 mg via INTRAMUSCULAR

## 2011-06-20 NOTE — Progress Notes (Signed)
Addended by: Sherre Lain A on: 06/20/2011 09:44 AM   Modules accepted: Orders

## 2011-06-20 NOTE — Progress Notes (Signed)
  Subjective:    Patient ID: Margaret [name], female    DOB: 11/15/1960, 50 y.o.   MRN: [id number]  HPI  Ms. Margaret [name] is a 50 yo separated AA G2P2 with a long h/o DUB.  She has finally quit bleeding now, but she has had daily VB since July.  Her HBG had decreased in spite of depo provera and po provera.  Her known fibroids have increased in size to the point that now her uterus measures more than 14 cm with 2 separate 6 cm fundal fibroids. She had a d&c and ablation in 2011 that didn't help.  Review of Systems    She is currently on disability due to her chronic back pain and depression.  She occasionally uses a cane to help her walk. She has been having hot flashes for about 2 months. She has been abstinent for about 1 1/2 years. She has had 2 c/s. Objective:   Physical Exam   Morbidly obese abdomen Bimanual reveals a 14 week size mobile uterus and non-palpable adnexa Reasonable pubic arch for vaginal removal of uterus with robotic assistance if fibroids shrink    Assessment & Plan:  Symptomatic fibroids- After a discussion of her options, she prefers to have a trial of depo Lupron.  If this alleves her problems, then she can continue it until menopause.  If not, I would recommend a hysterectomy.  If her fibroids shrink, a robotic approach would be feasible.

## 2011-07-30 ENCOUNTER — Ambulatory Visit (INDEPENDENT_AMBULATORY_CARE_PROVIDER_SITE_OTHER): Payer: Medicare Other | Admitting: Obstetrics & Gynecology

## 2011-07-30 ENCOUNTER — Encounter: Payer: Self-pay | Admitting: Obstetrics & Gynecology

## 2011-07-30 VITALS — BP 141/90 | HR 101 | Temp 97.7°F | Resp 20 | Ht 64.0 in | Wt 253.8 lb

## 2011-07-30 DIAGNOSIS — R109 Unspecified abdominal pain: Secondary | ICD-10-CM

## 2011-07-30 DIAGNOSIS — N939 Abnormal uterine and vaginal bleeding, unspecified: Secondary | ICD-10-CM

## 2011-07-30 DIAGNOSIS — D219 Benign neoplasm of connective and other soft tissue, unspecified: Secondary | ICD-10-CM

## 2011-07-30 DIAGNOSIS — Z01419 Encounter for gynecological examination (general) (routine) without abnormal findings: Secondary | ICD-10-CM

## 2011-07-30 DIAGNOSIS — N949 Unspecified condition associated with female genital organs and menstrual cycle: Secondary | ICD-10-CM

## 2011-07-30 DIAGNOSIS — N938 Other specified abnormal uterine and vaginal bleeding: Secondary | ICD-10-CM

## 2011-07-30 DIAGNOSIS — Z1231 Encounter for screening mammogram for malignant neoplasm of breast: Secondary | ICD-10-CM

## 2011-07-30 DIAGNOSIS — N926 Irregular menstruation, unspecified: Secondary | ICD-10-CM

## 2011-07-30 DIAGNOSIS — Z0000 Encounter for general adult medical examination without abnormal findings: Secondary | ICD-10-CM

## 2011-07-30 DIAGNOSIS — D649 Anemia, unspecified: Secondary | ICD-10-CM

## 2011-07-30 DIAGNOSIS — N925 Other specified irregular menstruation: Secondary | ICD-10-CM

## 2011-07-30 DIAGNOSIS — D259 Leiomyoma of uterus, unspecified: Secondary | ICD-10-CM

## 2011-07-30 MED ORDER — LEUPROLIDE ACETATE 3.75 MG IM KIT: 3.7500 mg | PACK | Freq: Once | INTRAMUSCULAR | Status: AC

## 2011-07-30 MED ORDER — LEUPROLIDE ACETATE (3 MONTH) 11.25 MG IM KIT: 11.2500 mg | PACK | INTRAMUSCULAR | Status: AC

## 2011-07-30 MED ORDER — LEUPROLIDE ACETATE 3.75 MG IM KIT
3.7500 mg | PACK | Freq: Once | INTRAMUSCULAR | Status: AC
  Administered 2011-07-30: 3.75 mg via INTRAMUSCULAR

## 2011-07-30 NOTE — Progress Notes (Signed)
Addended by: Toula Moos on: 07/30/2011 01:48 PM   Modules accepted: Orders

## 2011-07-30 NOTE — Progress Notes (Signed)
  Subjective:    Patient ID: Margaret [name], female    DOB: [birth year], 50 y.o.   MRN: [id number]  HPI  Margaret [name] is a 50 yo separated AA lady with large fibroids, anemia, and DUB in spite of an endometrial ablation.  She was given a depo Lupron on Oct 12.  She says she has now quit bleeding for a week and prior to that it was much lighter. She also complains of left flank pain.  Review of Systems Pap normal 5/12    Objective:   Physical Exam   No CVAT     Assessment & Plan:  sypmptomatic fibroids-good relief with depo Lupron.  She brought in another 3.75 mg dose and will get that today.  I have now ordered a 3 month shot which she will come in for in a month. Flank pain- check UC&S. I have recommended IBU prn.

## 2011-08-01 LAB — URINE CULTURE: Colony Count: NO GROWTH

## 2011-08-27 ENCOUNTER — Other Ambulatory Visit (HOSPITAL_COMMUNITY): Payer: Self-pay | Admitting: Cardiology

## 2011-08-27 DIAGNOSIS — E079 Disorder of thyroid, unspecified: Secondary | ICD-10-CM

## 2011-08-29 ENCOUNTER — Ambulatory Visit (INDEPENDENT_AMBULATORY_CARE_PROVIDER_SITE_OTHER): Payer: Medicare Other

## 2011-08-29 VITALS — BP 139/90 | HR 109

## 2011-08-29 DIAGNOSIS — N938 Other specified abnormal uterine and vaginal bleeding: Secondary | ICD-10-CM

## 2011-08-29 DIAGNOSIS — N925 Other specified irregular menstruation: Secondary | ICD-10-CM

## 2011-08-29 DIAGNOSIS — N949 Unspecified condition associated with female genital organs and menstrual cycle: Secondary | ICD-10-CM

## 2011-08-29 MED ORDER — LEUPROLIDE ACETATE (3 MONTH) 11.25 MG IM KIT
11.2500 mg | PACK | Freq: Once | INTRAMUSCULAR | Status: AC
  Administered 2011-08-29: 11.25 mg via INTRAMUSCULAR

## 2011-09-01 ENCOUNTER — Ambulatory Visit (HOSPITAL_COMMUNITY)
Admission: RE | Admit: 2011-09-01 | Discharge: 2011-09-01 | Disposition: A | Discharge status: Home / Self Care | Payer: Medicare Other | Source: Ambulatory Visit | Attending: Cardiology | Admitting: Cardiology

## 2011-09-01 DIAGNOSIS — E042 Nontoxic multinodular goiter: Secondary | ICD-10-CM | POA: Insufficient documentation

## 2011-09-01 DIAGNOSIS — R1310 Dysphagia, unspecified: Secondary | ICD-10-CM | POA: Insufficient documentation

## 2011-09-01 DIAGNOSIS — E079 Disorder of thyroid, unspecified: Secondary | ICD-10-CM | POA: Insufficient documentation

## 2011-09-01 DIAGNOSIS — R6889 Other general symptoms and signs: Secondary | ICD-10-CM | POA: Insufficient documentation

## 2011-09-01 LAB — URINALYSIS, MICROSCOPIC ONLY
Glucose, UA: NEGATIVE mg/dL
Hgb urine dipstick: NEGATIVE
Ketones, ur: NEGATIVE mg/dL
Protein, ur: NEGATIVE mg/dL
pH: 7 (ref 5.0–8.0)

## 2011-09-01 LAB — COMPREHENSIVE METABOLIC PANEL
ALT: 11 U/L (ref 0–35)
Alkaline Phosphatase: 96 U/L (ref 39–117)
BUN: 13 mg/dL (ref 6–23)
CO2: 29 mEq/L (ref 19–32)
Calcium: 9.6 mg/dL (ref 8.4–10.5)
GFR calc Af Amer: >90 mL/min (ref >90)
GFR calc non Af Amer: 83 mL/min — ABNORMAL LOW (ref >90)
Glucose, Bld: 120 mg/dL — ABNORMAL HIGH (ref 70–99)
Potassium: 3.9 mEq/L (ref 3.5–5.1)
Sodium: 136 mEq/L (ref 135–145)

## 2011-09-01 LAB — CBC
HCT: 36.4 % (ref 36.0–46.0)
Hemoglobin: 10.6 g/dL — ABNORMAL LOW (ref 12.0–15.0)
MCH: 21 pg — ABNORMAL LOW (ref 26.0–34.0)
MCHC: 29.1 g/dL — ABNORMAL LOW (ref 30.0–36.0)
RBC: 5.05 MIL/uL (ref 3.87–5.11)

## 2011-09-01 LAB — T4, FREE: Free T4: 0.75 ng/dL — ABNORMAL LOW (ref 0.80–1.80)

## 2011-09-01 LAB — LIPID PANEL: LDL Cholesterol: 96 mg/dL (ref 0–99)

## 2011-09-01 LAB — TSH: TSH: 0.714 u[IU]/mL (ref 0.350–4.500)

## 2011-09-02 LAB — URINE CULTURE: Culture  Setup Time: 201212241140

## 2011-10-24 ENCOUNTER — Ambulatory Visit (HOSPITAL_COMMUNITY): Payer: Medicare Other

## 2011-11-18 ENCOUNTER — Ambulatory Visit (HOSPITAL_COMMUNITY)
Admission: RE | Admit: 2011-11-18 | Discharge: 2011-11-18 | Disposition: A | Discharge status: Home / Self Care | Payer: Medicare Other | Source: Ambulatory Visit | Attending: Obstetrics & Gynecology | Admitting: Obstetrics & Gynecology

## 2011-11-18 DIAGNOSIS — Z1231 Encounter for screening mammogram for malignant neoplasm of breast: Secondary | ICD-10-CM | POA: Insufficient documentation

## 2011-11-24 ENCOUNTER — Telehealth: Payer: Self-pay | Admitting: *Deleted

## 2011-11-24 NOTE — Telephone Encounter (Signed)
Pt left a message stating that she recently moved to [facility]. She wanted to know if there are any clinics in [place] that can administer her her shot. She is scheduled to come here on 3/21.

## 2011-11-25 NOTE — Telephone Encounter (Signed)
Called pt and left message on her personal voice mail that she must receive her injection in our clinic because our doctors are not on staff @ any clinic or hospital in [place] and are responsible for her care. She may change the injection appt if needed or call back and leave new message if she has additional questions.

## 2011-11-27 ENCOUNTER — Ambulatory Visit: Payer: Medicare Other

## 2011-12-02 ENCOUNTER — Ambulatory Visit (INDEPENDENT_AMBULATORY_CARE_PROVIDER_SITE_OTHER): Payer: Medicare Other | Admitting: *Deleted

## 2011-12-02 VITALS — BP 146/88 | HR 88

## 2011-12-02 DIAGNOSIS — N938 Other specified abnormal uterine and vaginal bleeding: Secondary | ICD-10-CM

## 2011-12-02 DIAGNOSIS — N939 Abnormal uterine and vaginal bleeding, unspecified: Secondary | ICD-10-CM

## 2011-12-02 DIAGNOSIS — N926 Irregular menstruation, unspecified: Secondary | ICD-10-CM

## 2011-12-02 MED ORDER — LEUPROLIDE ACETATE (3 MONTH) 11.25 MG IM KIT
11.2500 mg | PACK | Freq: Once | INTRAMUSCULAR | Status: AC
  Administered 2011-12-02: 11.25 mg via INTRAMUSCULAR

## 2012-02-18 ENCOUNTER — Other Ambulatory Visit (HOSPITAL_COMMUNITY)
Admission: RE | Admit: 2012-02-18 | Discharge: 2012-02-18 | Disposition: A | Discharge status: Home / Self Care | Payer: Medicare Other | Source: Ambulatory Visit | Attending: Obstetrics & Gynecology | Admitting: Obstetrics & Gynecology

## 2012-02-18 ENCOUNTER — Encounter: Payer: Self-pay | Admitting: Obstetrics & Gynecology

## 2012-02-18 ENCOUNTER — Ambulatory Visit (INDEPENDENT_AMBULATORY_CARE_PROVIDER_SITE_OTHER): Payer: Medicare Other | Admitting: Obstetrics & Gynecology

## 2012-02-18 VITALS — BP 155/94 | HR 85 | Temp 98.4°F | Ht 65.0 in | Wt 237.8 lb

## 2012-02-18 DIAGNOSIS — Z01419 Encounter for gynecological examination (general) (routine) without abnormal findings: Secondary | ICD-10-CM

## 2012-02-18 DIAGNOSIS — Z113 Encounter for screening for infections with a predominantly sexual mode of transmission: Secondary | ICD-10-CM | POA: Insufficient documentation

## 2012-02-18 DIAGNOSIS — Z7251 High risk heterosexual behavior: Secondary | ICD-10-CM

## 2012-02-18 DIAGNOSIS — D649 Anemia, unspecified: Secondary | ICD-10-CM

## 2012-02-18 DIAGNOSIS — Z124 Encounter for screening for malignant neoplasm of cervix: Secondary | ICD-10-CM | POA: Insufficient documentation

## 2012-02-18 DIAGNOSIS — Z0000 Encounter for general adult medical examination without abnormal findings: Secondary | ICD-10-CM

## 2012-02-18 MED ORDER — LEUPROLIDE ACETATE (3 MONTH) 11.25 MG IM KIT: 11.2500 mg | PACK | INTRAMUSCULAR | Status: AC

## 2012-02-18 NOTE — Progress Notes (Signed)
Subjective:    Margaret [name] is a 51 y.o. female who presents for an annual exam. The patient has no complaints today. She wants another depo Lupron shot but when she went to the pharmacy, they told her that it needed to be pre certified. The patient is not currently sexually active. ("dumped" by boyfriend 4 weeks ago. Wants STI testing. GYN screening history: last pap: was normal. The patient wears seatbelts: yes. The patient participates in regular exercise: no. Has the patient ever been transfused or tattooed?: not asked. The patient reports that there is not domestic violence in her life.   Menstrual History: OB History    Grav Para Term Preterm Abortions TAB SAB Ect Mult Living   2 2 2  0 0 0 0 0 0 2      Menarche age: 67 No LMP recorded. Patient has had an injection.    The following portions of the patient's history were reviewed and updated as appropriate: allergies, current medications, past family history, past medical history, past social history, past surgical history and problem list.  Review of Systems A comprehensive review of systems was negative.    Objective:    BP 155/94  Pulse 85  Temp 98.4 F (36.9 C) (Oral)  Ht 5\' 5"  (1.651 m)  Wt 237 lb 12.8 oz (107.865 kg)  BMI 39.57 kg/m2  General Appearance:    Alert, cooperative, no distress, appears stated age  Head:    Normocephalic, without obvious abnormality, atraumatic  Eyes:    PERRL, conjunctiva/corneas clear, EOM's intact, fundi    benign, both eyes  Ears:    Normal TM's and external ear canals, both ears  Nose:   Nares normal, septum midline, mucosa normal, no drainage    or sinus tenderness  Throat:   Lips, mucosa, and tongue normal; teeth and gums normal  Neck:   Supple, symmetrical, trachea midline, no adenopathy;    thyroid:  no enlargement/tenderness/nodules; no carotid   bruit or JVD  Back:     Symmetric, no curvature, ROM normal, no CVA tenderness  Lungs:     Clear to auscultation bilaterally,  respirations unlabored  Chest Wall:    No tenderness or deformity   Heart:    Regular rate and rhythm, S1 and S2 normal, no murmur, rub   or gallop  Breast Exam:    No tenderness, masses, or nipple abnormality  Abdomen:     Soft, non-tender, bowel sounds active all four quadrants,    no masses, no organomegaly  Genitalia:    Normal female without lesion, discharge or tenderness, ULN size, NT, mobile, no adnexal masses or tenderness     Extremities:   Extremities normal, atraumatic, no cyanosis or edema  Pulses:   2+ and symmetric all extremities  Skin:   Skin color, texture, turgor normal, no rashes or lesions  Lymph nodes:   Cervical, supraclavicular, and axillary nodes normal  Neurologic:   CNII-XII intact, normal strength, sensation and reflexes    throughout  .    Assessment:    Healthy female exam.  Fibroids Desire for STI testing   Plan:     Pap smear.  Depo Lupron

## 2012-02-19 LAB — HEPATITIS B SURFACE ANTIGEN: Hepatitis B Surface Ag: NEGATIVE

## 2012-02-19 LAB — HEPATITIS C ANTIBODY: HCV Ab: NEGATIVE

## 2012-02-23 ENCOUNTER — Telehealth: Payer: Self-pay | Admitting: General Practice

## 2012-02-23 NOTE — Telephone Encounter (Signed)
Patient called stating she saw Dr Marice Potter on 6/12 and Dr Marice Potter called in a prescription to her [facility] in [facility] and the pharmacy told her they needed the proper authorization for her Lupron Depot shot. Patient unsure what she needs to do and wants [place] to give her a call back

## 2012-02-25 ENCOUNTER — Ambulatory Visit: Payer: Medicare Other

## 2012-02-27 NOTE — Telephone Encounter (Addendum)
Called pt and pt informed me that her pharmacy told her that they needed authorization for her to receive her Depo Lupron.  I called pharmacy while pt was on the telephone and according to [facility], pharmacist,  the pt was not able to receive medication due to her insurance not covering it.  According to pts chart pt has had 2 injections of Depo Lupron in Oct 2012 and Mar 2013.  Per Dr. Macon Large patient needs to be scheduled to see Dr. Marice Potter for follow up to find out since her insurance will not cover it to find out what is her next option. Pt stated that she is currently not having any bleeding. I advised pt to please come in to fill out a Depo Lupron application asap so that she may find out approval before her appt.  Pt was understanding and had no further questions.  She has been scheduled an appt for July 17 @ 415pm.  Sherry Ruffing stated that she would call and inform the pt of her appt.

## 2012-03-24 ENCOUNTER — Ambulatory Visit (INDEPENDENT_AMBULATORY_CARE_PROVIDER_SITE_OTHER): Payer: Medicare Other | Admitting: Obstetrics & Gynecology

## 2012-03-24 ENCOUNTER — Ambulatory Visit: Payer: Medicare Other | Admitting: Obstetrics & Gynecology

## 2012-03-24 VITALS — BP 120/84 | HR 98 | Temp 97.7°F | Ht 65.0 in | Wt 237.1 lb

## 2012-03-24 DIAGNOSIS — D259 Leiomyoma of uterus, unspecified: Secondary | ICD-10-CM

## 2012-03-24 DIAGNOSIS — D219 Benign neoplasm of connective and other soft tissue, unspecified: Secondary | ICD-10-CM | POA: Insufficient documentation

## 2012-03-24 MED ORDER — MEDROXYPROGESTERONE ACETATE 150 MG/ML IM SUSP
150.0000 mg | Freq: Once | INTRAMUSCULAR | Status: AC
  Administered 2012-03-24: 150 mg via INTRAMUSCULAR

## 2012-03-24 NOTE — Progress Notes (Signed)
  Subjective:    Patient ID: Margaret [name], female    DOB: 1961/03/30, 51 y.o.   MRN: [id number]  HPI  Margaret [name] is here for follow up and to discuss treatment options. Her menorrhagia was unresponsive to an endometrial ablation, but has resolved with depo Lupron. Unfortunately, she was declined another depo Lupron shot by through the assistance program.  She would prefer to avoid surgery if possible, so I have recommended a trial of depo provera.  Review of Systems   mammogram 3/13 normal  Objective:   Physical Exam        Assessment & Plan:  fiborids and currently resolved menorrhagia- depo provera today and again q 3 months. If this does not work, she would benefit from a hysterectomy.

## 2012-05-17 ENCOUNTER — Telehealth: Payer: Self-pay | Admitting: *Deleted

## 2012-05-17 NOTE — Telephone Encounter (Signed)
Patient called stating that she has started bleeding again and has been for about a month. I reviewed her chart and according to Dr. Norman Clay last note patient was denied for Lupron injections and was doing a trial of Depo Provera. If this didn't work for bleeding the note said that pt would need to consider a hysterectomy. I made an appt for patient with Dr. Macon Large on 06/09/12 at 1:45. I attempted to call [place] but had to leave a voicemail for her to call back.

## 2012-05-18 NOTE — Telephone Encounter (Signed)
I spoke w/Dr. Marice Potter regarding pt's Hx, current condition and request for medication. I also informed Dr. Marice Potter that pt's Medicare will cover Lupron Depot injection if given @ our office.  Dr. Marice Potter stated that she would like pt to have Lupron Depot 11.25mg  IM this week. She declined to prescribe other pain med or Provera as she feels the Lupron will help w/both of these issues.  I called pt and left a message that I have spoken w/Dr. Marice Potter and would like to inform her of our conversation.  Please call back to nurse voice mail and leave message of when she can be reached or if I may leave the information on her voice mail.

## 2012-05-18 NOTE — Telephone Encounter (Signed)
Informed pt of appt with Dr. Macon Large. She agrees with this plan. She wanted to know if we can prescribe Provera to help her stop bleeding prior to the appt. I told her I would send a message to the dr and we would call her back to let her know.

## 2012-05-18 NOTE — Telephone Encounter (Signed)
Pt called again and would also like to know if something can be prescribed for her cramping. She has been using ibuprofen 800mg  and it is not working. I advised that I would route the message to her doctor and see if they would authorize any medicine. Pt agrees.

## 2012-05-19 NOTE — Telephone Encounter (Signed)
Pt returned call yesterday and stated that a message may be left on her voice mail if she does not answer.  I called her this morning and informed her of my conversation w/dr. Marice Potter. Pt agrees to plan of care to receive Lupron Depot injection and no additional meds for pain or bleeding while we wait to see how the Lupron will work. Pt will come in tomorrow @ 0930.

## 2012-05-20 ENCOUNTER — Ambulatory Visit (INDEPENDENT_AMBULATORY_CARE_PROVIDER_SITE_OTHER): Payer: Medicare Other

## 2012-05-20 VITALS — BP 124/84 | HR 74 | Wt 234.8 lb

## 2012-05-20 DIAGNOSIS — N925 Other specified irregular menstruation: Secondary | ICD-10-CM

## 2012-05-20 DIAGNOSIS — N938 Other specified abnormal uterine and vaginal bleeding: Secondary | ICD-10-CM

## 2012-05-20 DIAGNOSIS — N949 Unspecified condition associated with female genital organs and menstrual cycle: Secondary | ICD-10-CM

## 2012-05-20 MED ORDER — LEUPROLIDE ACETATE (3 MONTH) 11.25 MG IM KIT
11.2500 mg | PACK | Freq: Once | INTRAMUSCULAR | Status: AC
  Administered 2012-05-20: 11.25 mg via INTRAMUSCULAR

## 2012-06-09 ENCOUNTER — Ambulatory Visit (INDEPENDENT_AMBULATORY_CARE_PROVIDER_SITE_OTHER): Payer: 59 | Admitting: Obstetrics & Gynecology

## 2012-06-09 ENCOUNTER — Encounter: Payer: Self-pay | Admitting: Obstetrics & Gynecology

## 2012-06-09 VITALS — BP 147/87 | HR 99 | Temp 98.0°F | Resp 12 | Ht 65.0 in | Wt 231.4 lb

## 2012-06-09 DIAGNOSIS — N926 Irregular menstruation, unspecified: Secondary | ICD-10-CM

## 2012-06-09 DIAGNOSIS — N925 Other specified irregular menstruation: Secondary | ICD-10-CM

## 2012-06-09 DIAGNOSIS — D259 Leiomyoma of uterus, unspecified: Secondary | ICD-10-CM

## 2012-06-09 DIAGNOSIS — N949 Unspecified condition associated with female genital organs and menstrual cycle: Secondary | ICD-10-CM

## 2012-06-09 DIAGNOSIS — D219 Benign neoplasm of connective and other soft tissue, unspecified: Secondary | ICD-10-CM

## 2012-06-09 DIAGNOSIS — N938 Other specified abnormal uterine and vaginal bleeding: Secondary | ICD-10-CM

## 2012-06-09 LAB — CBC
HCT: 32.5 % — ABNORMAL LOW (ref 36.0–46.0)
Hemoglobin: 10.1 g/dL — ABNORMAL LOW (ref 12.0–15.0)
MCHC: 31.1 g/dL (ref 30.0–36.0)
RBC: 4.28 MIL/uL (ref 3.87–5.11)
WBC: 5.9 10*3/uL (ref 4.0–10.5)

## 2012-06-09 NOTE — Progress Notes (Signed)
History:  51 y.o. [id number] here today for discussion about hysterectomy. She has a long history of DUB, had failed endometrial ablation in 2011. Has been on Depo Provera and Depo Lupron without much success.  She feels very tired all the time. She wants definitive management. Two prior cesarean sections.  The following portions of the patient's history were reviewed and updated as appropriate: allergies, current medications, past family history, past medical history, past social history, past surgical history and problem list.  Review of Systems:  Pertinent items are noted in HPI.  Objective:  Physical Exam Blood pressure 147/87, pulse 99, temperature 98 F (36.7 C), temperature source Oral, resp. rate 12, height 5\' 5"  (1.651 m), weight 231 lb 6.4 oz (104.962 kg). Deferred  Labs and Imaging 04/17/2011 TRANSABDOMINAL AND TRANSVAGINAL ULTRASOUND OF PELVIS Comparison: April 07, 2010 Findings: The uterus is enlarged and lobular in contour, measuring 14.8 x 5.9 x 6.3 cm (previously 10.4 x 4.9 x 4.6 cm).  Again noted is a right fundal myometrial fibroid that measures 6.1 cm (previously 5.1 cm.) There is a left fundal myometrial fibroid that measures 3.2 cm (previously 2.9 cm). The endometrial stripe is homogeneous but slightly thickened, measuring 16 mm. Both ovaries have a normal size and appearance. The right ovary measures 4.4 x 2.3 x 2.5 cm, and the left ovary measures 3.5 x 2.4 x 2.7 cm. A small simple hydrosalpinx is seen in the left. There  are no adnexal masses or free pelvic fluid. IMPRESSION: Interval enlargement of uterine fibroids, as described above. Homogeneous but slightly thickened endometrium. Small left simple hydrosalpinx.  09/01/11 Hgb 10.1  02/18/2012 Normal pap smear, negative GC and Chlam  Assessment & Plan:  Will obtain a pelvic ultrasound. Recommended endometrial biopsy, patient does not want to do this today. CBC and TSH ordered. Given size of her uterus (and pending ultrasound  results), she may be a good candidate for robot-assisted hysterectomy. Patient has seen Dr. Marice Potter in the past, and wants to discuss this modality with her.  Appointment will be scheduled with Dr. Marice Potter to discuss surgery and undergo endometrial biopsy. Bleeding and anemia precautions reviewed. Patient was given a handout about hysterectomy to review at home.

## 2012-06-09 NOTE — Patient Instructions (Signed)
Hysterectomy Information   A hysterectomy is a procedure where your uterus is surgically removed. It will no longer be possible to have menstrual periods or to become pregnant. The tubes and ovaries can be removed (bilateral salpingo-oopherectomy) during this surgery as well.    REASONS FOR A HYSTERECTOMY  · Persistent, abnormal bleeding.  · Lasting (chronic) pelvic pain or infection.  · The lining of the uterus (endometrium) starts growing outside the uterus (endometriosis).  · The endometrium starts growing in the muscle of the uterus (adenomyosis).  · The uterus falls down into the vagina (pelvic organ prolapse).  · Symptomatic uterine fibroids.  · Precancerous cells.  · Cervical cancer or uterine cancer.  TYPES OF HYSTERECTOMIES  · Supracervical hysterectomy. This type removes the top part of the uterus, but not the cervix.  · Total hysterectomy. This type removes the uterus and cervix.  · Radical hysterectomy. This type removes the uterus, cervix, and the fibrous tissue that holds the uterus in place in the pelvis (parametrium).  WAYS A HYSTERECTOMY CAN BE PERFORMED  · Abdominal hysterectomy. A large surgical cut (incision) is made in the abdomen. The uterus is removed through this incision.  · Vaginal hysterectomy. An incision is made in the vagina. The uterus is removed through this incision. There are no abdominal incisions.  · Conventional laparoscopic hysterectomy. A thin, lighted tube with a camera (laparoscope) is inserted into 3 or 4 small incisions in the abdomen. The uterus is cut into small pieces. The small pieces are removed through the incisions, or they are removed through the vagina.  · Laparoscopic assisted vaginal hysterectomy (LAVH). Three or four small incisions are made in the abdomen. Part of the surgery is performed laparoscopically and part vaginally. The uterus is removed through the vagina.  · Robot-assisted laparoscopic hysterectomy. A laparoscope is inserted into 3 or 4 small  incisions in the abdomen. A computer-controlled device is used to give the surgeon a 3D image. This allows for more precise movements of surgical instruments. The uterus is cut into small pieces and removed through the incisions or removed through the vagina.  RISKS OF HYSTERECTOMY    · Bleeding and risk of blood transfusion. Tell your caregiver if you do not want to receive any blood products.  · Blood clots in the legs or lung.  · Infection.  · Injury to surrounding organs.  · Anesthesia problems or side effects.  · Conversion to an abdominal hysterectomy.  WHAT TO EXPECT AFTER A HYSTERECTOMY  · You will be given pain medicine.  · You will need to have someone with you for the first 3 to 5 days after you go home.  · You will need to follow up with your surgeon in 2 to 4 weeks after surgery to evaluate your progress.  · You may have early menopause symptoms like hot flashes, night sweats, and insomnia.  · If you had a hysterectomy for a problem that was not a cancer or a condition that could lead to cancer, then you no longer need Pap tests. However, even if you no longer need a Pap test, a regular exam is a good idea to make sure no other problems are starting.  Document Released: 02/18/2001 Document Revised: 11/17/2011 Document Reviewed: 04/05/2011  Exit[employer]® Patient Information ©2013 [employer].

## 2012-06-10 ENCOUNTER — Telehealth: Payer: Self-pay

## 2012-06-10 LAB — TSH: TSH: 0.986 u[IU]/mL (ref 0.350–4.500)

## 2012-06-10 NOTE — Telephone Encounter (Signed)
Pt returned our call. I informed her of Dr. Mont Dutton advice below. She agrees with plan.

## 2012-06-10 NOTE — Telephone Encounter (Signed)
Message copied by Faythe Casa on Thu Jun 10, 2012  8:32 AM ------      Message from: Margaret [name] A      Created: Thu Jun 10, 2012  8:32 AM       Stable hemoglobin, no need for transfusion. Patient should continue iron pills twice a day. Normal thyroid study. Will await pelvic ultrasound results and upcoming endometrial biopsy.

## 2012-06-10 NOTE — Telephone Encounter (Signed)
Called pt and left message to return our call to the clinics.  

## 2012-06-15 ENCOUNTER — Ambulatory Visit (HOSPITAL_COMMUNITY)
Admission: RE | Admit: 2012-06-15 | Discharge: 2012-06-15 | Disposition: A | Discharge status: Home / Self Care | Payer: Medicare HMO | Source: Ambulatory Visit | Attending: Obstetrics & Gynecology | Admitting: Obstetrics & Gynecology

## 2012-06-15 DIAGNOSIS — N925 Other specified irregular menstruation: Secondary | ICD-10-CM | POA: Insufficient documentation

## 2012-06-15 DIAGNOSIS — N949 Unspecified condition associated with female genital organs and menstrual cycle: Secondary | ICD-10-CM | POA: Insufficient documentation

## 2012-06-15 DIAGNOSIS — D219 Benign neoplasm of connective and other soft tissue, unspecified: Secondary | ICD-10-CM

## 2012-06-15 DIAGNOSIS — D252 Subserosal leiomyoma of uterus: Secondary | ICD-10-CM | POA: Insufficient documentation

## 2012-06-15 DIAGNOSIS — N938 Other specified abnormal uterine and vaginal bleeding: Secondary | ICD-10-CM | POA: Insufficient documentation

## 2012-06-16 ENCOUNTER — Telehealth: Payer: Self-pay | Admitting: *Deleted

## 2012-06-16 NOTE — Progress Notes (Signed)
Called Margaret [name] as requested and informed her overall uterine size has decreased with the Lupron. Also discussed plan for next appointment next week to have  Endometrial biopsy and discuss surgery with Dr. Marice Potter. Patient voices understanding.

## 2012-06-16 NOTE — Telephone Encounter (Signed)
Called patient as requested and discuss results and plan as requested. Patient voices understanding.

## 2012-06-16 NOTE — Telephone Encounter (Signed)
Message copied by Gerome Apley on Wed Jun 16, 2012  9:53 AM ------      Message from: Jaynie Collins A      Created: Tue Jun 15, 2012  3:05 PM       Overall uterine size has decreased as a result of being on Lupron.  She will undergo endometrial biopsy and discuss surgery with Dr. Marice Potter during their appointment on 06/21/12. Please call to inform patient of results.

## 2012-06-21 ENCOUNTER — Encounter: Payer: Self-pay | Admitting: Obstetrics & Gynecology

## 2012-06-21 ENCOUNTER — Other Ambulatory Visit (HOSPITAL_COMMUNITY)
Admission: RE | Admit: 2012-06-21 | Discharge: 2012-06-21 | Disposition: A | Discharge status: Home / Self Care | Payer: Medicare HMO | Source: Ambulatory Visit | Attending: Obstetrics & Gynecology | Admitting: Obstetrics & Gynecology

## 2012-06-21 ENCOUNTER — Ambulatory Visit (INDEPENDENT_AMBULATORY_CARE_PROVIDER_SITE_OTHER): Payer: 59 | Admitting: Obstetrics & Gynecology

## 2012-06-21 VITALS — BP 134/90 | HR 90 | Temp 97.8°F | Ht 65.0 in | Wt 230.0 lb

## 2012-06-21 DIAGNOSIS — N938 Other specified abnormal uterine and vaginal bleeding: Secondary | ICD-10-CM | POA: Insufficient documentation

## 2012-06-21 DIAGNOSIS — D259 Leiomyoma of uterus, unspecified: Secondary | ICD-10-CM

## 2012-06-21 DIAGNOSIS — N949 Unspecified condition associated with female genital organs and menstrual cycle: Secondary | ICD-10-CM | POA: Insufficient documentation

## 2012-06-21 DIAGNOSIS — D219 Benign neoplasm of connective and other soft tissue, unspecified: Secondary | ICD-10-CM

## 2012-06-21 DIAGNOSIS — N925 Other specified irregular menstruation: Secondary | ICD-10-CM | POA: Insufficient documentation

## 2012-06-21 LAB — POCT PREGNANCY, URINE: Preg Test, Ur: NEGATIVE

## 2012-06-21 NOTE — Progress Notes (Signed)
  Subjective:    Patient ID: Margaret [name], female    DOB: [birth year], 51 y.o.   MRN: [id number]  HPI  Margaret [name] is here today to discuss her surgical options. She is getting frustrated with her continued DUB despite medical management. She remains anemic and is now ready for a hysterectomy. She is also here to have a EMBX.   Review of Systems   Pap smear and mammogram were done this year and were normal. Her uterus has shrunk since she took the depo lupron. Objective:   Physical Exam  Cervix prepped with betadine and grasped with a single tooth tenaculum. A Pipelle was used for 2 passes and I obtained a moderate amount of tissue.      Assessment & Plan:  Fibroids, menorrhagia. We discussed surgical routes (TAH, RATH). I explained that a Artist will speak with her about costs. I will post her for a RATH, but she may decide to change to a TAH is she prefers, based on cost.

## 2012-06-24 ENCOUNTER — Ambulatory Visit: Payer: Medicare Other

## 2012-06-29 ENCOUNTER — Encounter (HOSPITAL_COMMUNITY): Payer: Self-pay | Admitting: Pharmacist

## 2012-07-07 ENCOUNTER — Inpatient Hospital Stay (HOSPITAL_COMMUNITY): Admission: RE | Admit: 2012-07-07 | Payer: 59 | Source: Ambulatory Visit

## 2012-07-12 ENCOUNTER — Encounter (HOSPITAL_COMMUNITY)
Admission: RE | Admit: 2012-07-12 | Discharge: 2012-07-12 | Disposition: A | Discharge status: Home / Self Care | Payer: Medicare HMO | Source: Ambulatory Visit | Attending: Obstetrics & Gynecology | Admitting: Obstetrics & Gynecology

## 2012-07-12 ENCOUNTER — Encounter (HOSPITAL_COMMUNITY): Payer: Self-pay

## 2012-07-12 ENCOUNTER — Other Ambulatory Visit (HOSPITAL_COMMUNITY): Payer: 59

## 2012-07-12 ENCOUNTER — Other Ambulatory Visit: Payer: Self-pay

## 2012-07-12 LAB — BASIC METABOLIC PANEL
CO2: 26 mEq/L (ref 19–32)
Chloride: 100 mEq/L (ref 96–112)
Creatinine, Ser: 0.8 mg/dL (ref 0.50–1.10)
GFR calc Af Amer: >90 mL/min (ref >90)
Potassium: 3.3 mEq/L — ABNORMAL LOW (ref 3.5–5.1)
Sodium: 135 mEq/L (ref 135–145)

## 2012-07-12 LAB — CBC
Platelets: 394 10*3/uL (ref 150–400)
RBC: 4.27 MIL/uL (ref 3.87–5.11)
RDW: 16.6 % — ABNORMAL HIGH (ref 11.5–15.5)
WBC: 10.4 10*3/uL (ref 4.0–10.5)

## 2012-07-12 LAB — SURGICAL PCR SCREEN
MRSA, PCR: NEGATIVE
Staphylococcus aureus: NEGATIVE

## 2012-07-12 NOTE — Patient Instructions (Addendum)
20 Margaret [name]  07/12/2012   Your procedure is scheduled on:  07/14/12  Enter through the Main Entrance of [facility] at 1130 AM.  Pick up the phone at the desk and dial [phone].   Call this number if you have problems the morning of surgery: [phone]   Remember:   Do not eat food:After Midnight.  Do not drink clear liquids: 4 Hours before arrival.  Take these medicines the morning of surgery with A SIP OF WATER: Cymbalta, Blood pressure medication, may use Flonase   Do not wear jewelry, make-up or nail polish.  Do not wear lotions, powders, or perfumes. You may wear deodorant.  Do not shave 48 hours prior to surgery.  Do not bring valuables to the hospital.  Contacts, dentures or bridgework may not be worn into surgery.  Leave suitcase in the car. After surgery it may be brought to your room.  For patients admitted to the hospital, checkout time is 11:00 AM the day of discharge.   Patients discharged the day of surgery will not be allowed to drive home.  Name and phone number of your driver: NA  Special Instructions: Shower using CHG 2 nights before surgery and the night before surgery.  If you shower the day of surgery use CHG.  Use special wash - you have one bottle of CHG for all showers.  You should use approximately 1/3 of the bottle for each shower.   Please read over the following fact sheets that you were given: MRSA Information

## 2012-07-14 ENCOUNTER — Encounter (HOSPITAL_COMMUNITY): Payer: Self-pay | Admitting: Anesthesiology

## 2012-07-14 ENCOUNTER — Ambulatory Visit (HOSPITAL_COMMUNITY): Payer: Medicare HMO | Admitting: Anesthesiology

## 2012-07-14 ENCOUNTER — Inpatient Hospital Stay (HOSPITAL_COMMUNITY)
Admission: RE | Admit: 2012-07-14 | Discharge: 2012-07-16 | DRG: 743 | Disposition: A | Discharge status: Home / Self Care | Payer: Medicare HMO | Source: Ambulatory Visit | Attending: Obstetrics & Gynecology | Admitting: Obstetrics & Gynecology

## 2012-07-14 ENCOUNTER — Encounter (HOSPITAL_COMMUNITY)
Admission: RE | Disposition: A | Discharge status: Home / Self Care | Payer: Self-pay | Source: Ambulatory Visit | Attending: Obstetrics & Gynecology

## 2012-07-14 ENCOUNTER — Encounter (HOSPITAL_COMMUNITY): Payer: Self-pay | Admitting: *Deleted

## 2012-07-14 DIAGNOSIS — N920 Excessive and frequent menstruation with regular cycle: Secondary | ICD-10-CM | POA: Diagnosis present

## 2012-07-14 DIAGNOSIS — Z01812 Encounter for preprocedural laboratory examination: Secondary | ICD-10-CM

## 2012-07-14 DIAGNOSIS — N736 Female pelvic peritoneal adhesions (postinfective): Secondary | ICD-10-CM | POA: Diagnosis present

## 2012-07-14 DIAGNOSIS — D259 Leiomyoma of uterus, unspecified: Secondary | ICD-10-CM

## 2012-07-14 DIAGNOSIS — Z01818 Encounter for other preprocedural examination: Secondary | ICD-10-CM

## 2012-07-14 DIAGNOSIS — N840 Polyp of corpus uteri: Secondary | ICD-10-CM | POA: Diagnosis present

## 2012-07-14 DIAGNOSIS — N925 Other specified irregular menstruation: Secondary | ICD-10-CM

## 2012-07-14 DIAGNOSIS — N938 Other specified abnormal uterine and vaginal bleeding: Secondary | ICD-10-CM

## 2012-07-14 DIAGNOSIS — N949 Unspecified condition associated with female genital organs and menstrual cycle: Secondary | ICD-10-CM | POA: Diagnosis present

## 2012-07-14 DIAGNOSIS — N8300 Follicular cyst of ovary, unspecified side: Secondary | ICD-10-CM | POA: Diagnosis present

## 2012-07-14 DIAGNOSIS — D251 Intramural leiomyoma of uterus: Principal | ICD-10-CM

## 2012-07-14 DIAGNOSIS — D649 Anemia, unspecified: Secondary | ICD-10-CM | POA: Diagnosis present

## 2012-07-14 DIAGNOSIS — D219 Benign neoplasm of connective and other soft tissue, unspecified: Secondary | ICD-10-CM

## 2012-07-14 HISTORY — PX: ROBOTIC ASSISTED LAPAROSCOPIC LYSIS OF ADHESION: SHX6080

## 2012-07-14 HISTORY — PX: ABDOMINAL HYSTERECTOMY: SHX81

## 2012-07-14 LAB — PREGNANCY, URINE: Preg Test, Ur: NEGATIVE

## 2012-07-14 SURGERY — HYSTERECTOMY, ABDOMINAL
Anesthesia: General | Site: Abdomen | Wound class: Clean Contaminated

## 2012-07-14 MED ORDER — HYDROMORPHONE HCL PF 1 MG/ML IJ SOLN
INTRAMUSCULAR | Status: AC
  Administered 2012-07-14: 0.5 mg via INTRAVENOUS
  Filled 2012-07-14: qty 1

## 2012-07-14 MED ORDER — PROPOFOL 10 MG/ML IV EMUL
INTRAVENOUS | Status: AC
  Filled 2012-07-14: qty 20

## 2012-07-14 MED ORDER — LACTATED RINGERS IV SOLN
INTRAVENOUS | Status: DC
  Administered 2012-07-14 (×2): via INTRAVENOUS

## 2012-07-14 MED ORDER — KETOROLAC TROMETHAMINE 30 MG/ML IJ SOLN
30.0000 mg | Freq: Four times a day (QID) | INTRAMUSCULAR | Status: AC
  Administered 2012-07-14 – 2012-07-15 (×3): 30 mg via INTRAVENOUS
  Filled 2012-07-14 (×3): qty 1

## 2012-07-14 MED ORDER — GLYCOPYRROLATE 0.2 MG/ML IJ SOLN
INTRAMUSCULAR | Status: AC
  Filled 2012-07-14: qty 2

## 2012-07-14 MED ORDER — PHENYLEPHRINE HCL 10 MG/ML IJ SOLN
INTRAMUSCULAR | Status: DC | PRN
  Administered 2012-07-14 (×2): .08 mg via INTRAVENOUS

## 2012-07-14 MED ORDER — ONDANSETRON HCL 4 MG/2ML IJ SOLN
INTRAMUSCULAR | Status: AC
  Filled 2012-07-14: qty 2

## 2012-07-14 MED ORDER — HYDROMORPHONE HCL PF 1 MG/ML IJ SOLN
INTRAMUSCULAR | Status: AC
  Filled 2012-07-14: qty 1

## 2012-07-14 MED ORDER — FENTANYL CITRATE 0.05 MG/ML IJ SOLN
INTRAMUSCULAR | Status: AC
  Filled 2012-07-14: qty 5

## 2012-07-14 MED ORDER — ONDANSETRON HCL 4 MG/2ML IJ SOLN
INTRAMUSCULAR | Status: DC | PRN
  Administered 2012-07-14: 4 mg via INTRAVENOUS

## 2012-07-14 MED ORDER — DOCUSATE SODIUM 100 MG PO CAPS
100.0000 mg | ORAL_CAPSULE | Freq: Two times a day (BID) | ORAL | Status: DC
  Administered 2012-07-15 – 2012-07-16 (×3): 100 mg via ORAL
  Filled 2012-07-14 (×3): qty 1

## 2012-07-14 MED ORDER — LIDOCAINE HCL (CARDIAC) 20 MG/ML IV SOLN
INTRAVENOUS | Status: DC | PRN
  Administered 2012-07-14: 50 mg via INTRAVENOUS

## 2012-07-14 MED ORDER — FENTANYL CITRATE 0.05 MG/ML IJ SOLN
25.0000 ug | INTRAMUSCULAR | Status: DC | PRN
  Administered 2012-07-14 (×2): 50 ug via INTRAVENOUS

## 2012-07-14 MED ORDER — CEFAZOLIN SODIUM-DEXTROSE 2-3 GM-% IV SOLR
2.0000 g | INTRAVENOUS | Status: AC
  Administered 2012-07-14: 2 g via INTRAVENOUS

## 2012-07-14 MED ORDER — ROCURONIUM BROMIDE 50 MG/5ML IV SOLN
INTRAVENOUS | Status: AC
  Filled 2012-07-14: qty 1

## 2012-07-14 MED ORDER — FENTANYL CITRATE 0.05 MG/ML IJ SOLN
INTRAMUSCULAR | Status: AC
  Administered 2012-07-14: 50 ug via INTRAVENOUS
  Filled 2012-07-14: qty 2

## 2012-07-14 MED ORDER — NEOSTIGMINE METHYLSULFATE 1 MG/ML IJ SOLN
INTRAMUSCULAR | Status: AC
  Filled 2012-07-14: qty 10

## 2012-07-14 MED ORDER — SIMETHICONE 80 MG PO CHEW: 80.0000 mg | CHEWABLE_TABLET | Freq: Four times a day (QID) | ORAL | Status: DC | PRN

## 2012-07-14 MED ORDER — MEPERIDINE HCL 25 MG/ML IJ SOLN: 6.2500 mg | INTRAMUSCULAR | Status: DC | PRN

## 2012-07-14 MED ORDER — SODIUM CHLORIDE 0.9 % IJ SOLN
INTRAMUSCULAR | Status: DC | PRN
  Administered 2012-07-14: 10 mL

## 2012-07-14 MED ORDER — ROPIVACAINE HCL 5 MG/ML IJ SOLN
INTRAMUSCULAR | Status: AC
  Filled 2012-07-14: qty 60

## 2012-07-14 MED ORDER — OXYCODONE-ACETAMINOPHEN 5-325 MG PO TABS
1.0000 | ORAL_TABLET | ORAL | Status: DC | PRN
  Administered 2012-07-15 – 2012-07-16 (×3): 2 via ORAL
  Filled 2012-07-14 (×3): qty 2

## 2012-07-14 MED ORDER — PROMETHAZINE HCL 25 MG/ML IJ SOLN: 25.0000 mg | Freq: Four times a day (QID) | INTRAMUSCULAR | Status: DC | PRN

## 2012-07-14 MED ORDER — HYDROMORPHONE HCL PF 1 MG/ML IJ SOLN
0.2000 mg | INTRAMUSCULAR | Status: DC | PRN
  Administered 2012-07-14: 0.6 mg via INTRAVENOUS
  Administered 2012-07-14: 0.5 mg via INTRAVENOUS
  Administered 2012-07-15: 0.6 mg via INTRAVENOUS
  Filled 2012-07-14 (×3): qty 1

## 2012-07-14 MED ORDER — MIDAZOLAM HCL 5 MG/5ML IJ SOLN
INTRAMUSCULAR | Status: DC | PRN
  Administered 2012-07-14: 2 mg via INTRAVENOUS

## 2012-07-14 MED ORDER — GLYCOPYRROLATE 0.2 MG/ML IJ SOLN
INTRAMUSCULAR | Status: DC | PRN
  Administered 2012-07-14: 0.4 mg via INTRAVENOUS
  Administered 2012-07-14: 0.2 mg via INTRAVENOUS

## 2012-07-14 MED ORDER — HYDROMORPHONE HCL PF 1 MG/ML IJ SOLN
INTRAMUSCULAR | Status: DC | PRN
  Administered 2012-07-14: 1 mg via INTRAVENOUS

## 2012-07-14 MED ORDER — IBUPROFEN 800 MG PO TABS
800.0000 mg | ORAL_TABLET | Freq: Three times a day (TID) | ORAL | Status: DC | PRN
  Administered 2012-07-15 – 2012-07-16 (×3): 800 mg via ORAL
  Filled 2012-07-14 (×3): qty 1

## 2012-07-14 MED ORDER — KETOROLAC TROMETHAMINE 30 MG/ML IJ SOLN
15.0000 mg | Freq: Once | INTRAMUSCULAR | Status: AC | PRN
  Administered 2012-07-14: 30 mg via INTRAVENOUS

## 2012-07-14 MED ORDER — HYDROMORPHONE HCL PF 1 MG/ML IJ SOLN
0.5000 mg | INTRAMUSCULAR | Status: DC | PRN
  Administered 2012-07-14: 0.5 mg via INTRAVENOUS

## 2012-07-14 MED ORDER — PROPOFOL 10 MG/ML IV EMUL
INTRAVENOUS | Status: DC | PRN
  Administered 2012-07-14: 200 mg via INTRAVENOUS

## 2012-07-14 MED ORDER — PROMETHAZINE HCL 25 MG/ML IJ SOLN: 6.2500 mg | INTRAMUSCULAR | Status: DC | PRN

## 2012-07-14 MED ORDER — LIDOCAINE HCL (CARDIAC) 20 MG/ML IV SOLN
INTRAVENOUS | Status: AC
  Filled 2012-07-14: qty 5

## 2012-07-14 MED ORDER — KETOROLAC TROMETHAMINE 30 MG/ML IJ SOLN
INTRAMUSCULAR | Status: AC
  Administered 2012-07-14: 30 mg via INTRAVENOUS
  Filled 2012-07-14: qty 1

## 2012-07-14 MED ORDER — MIDAZOLAM HCL 2 MG/2ML IJ SOLN: 0.5000 mg | Freq: Once | INTRAMUSCULAR | Status: DC | PRN

## 2012-07-14 MED ORDER — FENTANYL CITRATE 0.05 MG/ML IJ SOLN
INTRAMUSCULAR | Status: DC | PRN
  Administered 2012-07-14 (×2): 150 ug via INTRAVENOUS
  Administered 2012-07-14 (×2): 100 ug via INTRAVENOUS

## 2012-07-14 MED ORDER — ROCURONIUM BROMIDE 100 MG/10ML IV SOLN
INTRAVENOUS | Status: DC | PRN
  Administered 2012-07-14: 20 mg via INTRAVENOUS
  Administered 2012-07-14: 50 mg via INTRAVENOUS

## 2012-07-14 MED ORDER — LACTATED RINGERS IR SOLN
Status: DC | PRN
  Administered 2012-07-14: 3000 mL

## 2012-07-14 MED ORDER — ROPIVACAINE HCL 5 MG/ML IJ SOLN
INTRAMUSCULAR | Status: DC | PRN
  Administered 2012-07-14: 120 mL

## 2012-07-14 MED ORDER — NEOSTIGMINE METHYLSULFATE 1 MG/ML IJ SOLN
INTRAMUSCULAR | Status: DC | PRN
  Administered 2012-07-14: 2.5 mg via INTRAVENOUS

## 2012-07-14 MED ORDER — MIDAZOLAM HCL 2 MG/2ML IJ SOLN
INTRAMUSCULAR | Status: AC
  Filled 2012-07-14: qty 2

## 2012-07-14 MED ORDER — CEFAZOLIN SODIUM-DEXTROSE 2-3 GM-% IV SOLR
INTRAVENOUS | Status: AC
  Filled 2012-07-14: qty 50

## 2012-07-14 MED ORDER — ZOLPIDEM TARTRATE 5 MG PO TABS: 5.0000 mg | ORAL_TABLET | Freq: Every evening | ORAL | Status: DC | PRN

## 2012-07-14 SURGICAL SUPPLY — 56 items
BAG URINE DRAINAGE (UROLOGICAL SUPPLIES) ×2 IMPLANT
BENZOIN TINCTURE PRP APPL 2/3 (GAUZE/BANDAGES/DRESSINGS) IMPLANT
BLADE SURG 10 STRL SS (BLADE) ×2 IMPLANT
CATH FOLEY 3WAY  5CC 16FR (CATHETERS) ×1
CATH FOLEY 3WAY 5CC 16FR (CATHETERS) ×1 IMPLANT
CLOTH BEACON ORANGE TIMEOUT ST (SAFETY) ×2 IMPLANT
COVER MAYO STAND STRL (DRAPES) ×2 IMPLANT
COVER TABLE BACK 60X90 (DRAPES) ×4 IMPLANT
DECANTER SPIKE VIAL GLASS SM (MISCELLANEOUS) ×2 IMPLANT
DERMABOND ADVANCED (GAUZE/BANDAGES/DRESSINGS) ×1
DERMABOND ADVANCED .7 DNX12 (GAUZE/BANDAGES/DRESSINGS) ×1 IMPLANT
DRAPE HUG U DISPOSABLE (DRAPE) ×2 IMPLANT
DRAPE LG THREE QUARTER DISP (DRAPES) ×6 IMPLANT
DRAPE WARM FLUID 44X44 (DRAPE) ×2 IMPLANT
DRESSING TELFA 8X3 (GAUZE/BANDAGES/DRESSINGS) ×2 IMPLANT
ELECT REM PT RETURN 9FT ADLT (ELECTROSURGICAL) ×2
ELECTRODE REM PT RTRN 9FT ADLT (ELECTROSURGICAL) ×1 IMPLANT
EVACUATOR SMOKE 8.L (FILTER) ×2 IMPLANT
GLOVE BIO SURGEON STRL SZ 6.5 (GLOVE) ×6 IMPLANT
GOWN STRL REIN XL XLG (GOWN DISPOSABLE) ×12 IMPLANT
KIT ACCESSORY DA VINCI DISP (KITS) ×1
KIT ACCESSORY DVNC DISP (KITS) ×1 IMPLANT
LEGGING LITHOTOMY PAIR STRL (DRAPES) ×2 IMPLANT
NEEDLE INSUFFLATION 14GA 120MM (NEEDLE) ×2 IMPLANT
NEEDLE SPNL 18GX3.5 QUINCKE PK (NEEDLE) ×2 IMPLANT
OCCLUDER COLPOPNEUMO (BALLOONS) ×2 IMPLANT
PACK LAVH (CUSTOM PROCEDURE TRAY) ×2 IMPLANT
PAD PREP 24X48 CUFFED NSTRL (MISCELLANEOUS) ×4 IMPLANT
PLUG CATH AND CAP STER (CATHETERS) ×2 IMPLANT
PROTECTOR NERVE ULNAR (MISCELLANEOUS) ×4 IMPLANT
SET CYSTO W/LG BORE CLAMP LF (SET/KITS/TRAYS/PACK) ×2 IMPLANT
SET IRRIG TUBING LAPAROSCOPIC (IRRIGATION / IRRIGATOR) ×2 IMPLANT
SOLUTION ELECTROLUBE (MISCELLANEOUS) ×2 IMPLANT
SPONGE LAP 18X18 X RAY DECT (DISPOSABLE) ×4 IMPLANT
STRIP CLOSURE SKIN 1/2X4 (GAUZE/BANDAGES/DRESSINGS) IMPLANT
SUT PDS AB 0 CTX 60 (SUTURE) ×2 IMPLANT
SUT VIC AB 0 CT1 27 (SUTURE) ×2
SUT VIC AB 0 CT1 27XBRD ANBCTR (SUTURE) ×2 IMPLANT
SUT VIC AB 2-0 CT1 18 (SUTURE) ×4 IMPLANT
SUT VIC AB 3-0 CT1 27 (SUTURE) ×1
SUT VIC AB 3-0 CT1 TAPERPNT 27 (SUTURE) ×1 IMPLANT
SUT VICRYL 0 TIES 12 18 (SUTURE) ×2 IMPLANT
SUT VICRYL 0 UR6 27IN ABS (SUTURE) ×4 IMPLANT
SUT VICRYL RAPIDE 4/0 PS 2 (SUTURE) ×4 IMPLANT
SYR 20CC LL (SYRINGE) ×2 IMPLANT
SYR 50ML LL SCALE MARK (SYRINGE) ×2 IMPLANT
SYR BULB IRRIGATION 50ML (SYRINGE) ×2 IMPLANT
SYRINGE 10CC LL (SYRINGE) ×2 IMPLANT
TIP UTERINE 6.7X10CM GRN DISP (MISCELLANEOUS) ×2 IMPLANT
TOWEL OR 17X24 6PK STRL BLUE (TOWEL DISPOSABLE) ×4 IMPLANT
TROCAR DISP BLADELESS 8 DVNC (TROCAR) ×1 IMPLANT
TROCAR DISP BLADELESS 8MM (TROCAR) ×1
TROCAR XCEL 12X100 BLDLESS (ENDOMECHANICALS) ×2 IMPLANT
TROCAR Z-THREAD 12X150 (TROCAR) ×2 IMPLANT
TUBING FILTER THERMOFLATOR (ELECTROSURGICAL) ×2 IMPLANT
WATER STERILE IRR 1000ML POUR (IV SOLUTION) ×6 IMPLANT

## 2012-07-14 NOTE — Anesthesia Preprocedure Evaluation (Addendum)
Anesthesia Evaluation  Patient identified by MRN, date of birth, ID band Patient awake    Reviewed: Allergy & Precautions, H&P , Patient's Chart, lab work & pertinent test results, reviewed documented beta blocker date and time   History of Anesthesia Complications Negative for: history of anesthetic complications  Airway Mallampati: III TM Distance: >3 FB Neck ROM: full   Comment: Overbite and large tongue Dental No notable dental hx.    Pulmonary neg pulmonary ROS,  breath sounds clear to auscultation  Pulmonary exam normal       Cardiovascular Exercise Tolerance: Good hypertension, negative cardio ROS  Rhythm:regular Rate:Normal     Neuro/Psych  Headaches, PSYCHIATRIC DISORDERS negative neurological ROS  negative psych ROS   GI/Hepatic negative GI ROS, Neg liver ROS,   Endo/Other  negative endocrine ROSMorbid obesity  Renal/GU negative Renal ROS     Musculoskeletal   Abdominal   Peds  Hematology negative hematology ROS (+)   Anesthesia Other Findings Hypertension     Anxiety        Depression     Chronic back pain        Anemia     Allergy        Menorrhagia     Fibroids        Migraines   not on medication Morbid obesity    Reproductive/Obstetrics negative OB ROS                          Anesthesia Physical Anesthesia Plan  ASA: III  Anesthesia Plan: General ETT   Post-op Pain Management:    Induction:   Airway Management Planned:   Additional Equipment:   Intra-op Plan:   Post-operative Plan:   Informed Consent: I have reviewed the patients History and Physical, chart, labs and discussed the procedure including the risks, benefits and alternatives for the proposed anesthesia with the patient or authorized representative who has indicated his/her understanding and acceptance.   Dental Advisory Given  Plan Discussed with: CRNA and Surgeon  Anesthesia Plan Comments:          position patient awake Anesthesia Quick Evaluation

## 2012-07-14 NOTE — H&P (Addendum)
Margaret [name] is an 51 y.o. female. She is here today for a RATH/BL salpingectomy/cystoscopy. She has had many years of DUB and menorrhagia resulting in chronic anemia. She has used depo provera, depo Lupron, d&c, and even an endometrial ablation in 2011 all in an attempt to relieve her DUB. She is still bleeding and anemic. Her fibroids did shrink with the use of Lupron. She is ready for a hysterectomy. Her EMBX was normal.  Pertinent Gynecological History: Menses: flow is excessive with use of 8 pads or tampons on heaviest days Bleeding: dysfunctional uterine bleeding Contraception: none DES exposure: denies Blood transfusions: none Sexually transmitted diseases: no past history Previous GYN Procedures: DNC  Last mammogram: normal Date:  Last pap: normal Date:     Menstrual History: Menarche age: 46 No LMP recorded. Patient is not currently having periods (Reason: Other).    Past Medical History  Diagnosis Date  . Hypertension   . Anxiety   . Depression   . Chronic back pain   . Anemia   . Allergy   . Menorrhagia   . Fibroids   . Migraines     not on medication  . Morbid obesity     Past Surgical History  Procedure Date  . Neck surgery     twice  . Endomerial ablation   . Endometrial ablation   . Cesarean section     x2    Family History  Problem Relation Age of Onset  . Hypertension Mother   . Hypertension Maternal Grandmother   . Heart disease Maternal Grandmother   . Hypertension Maternal Uncle     Social History:  reports that she has never smoked. She has never used smokeless tobacco. She reports that she drinks alcohol. She reports that she does not use illicit drugs.  Allergies:  Allergies  Allergen Reactions  . Penicillins Hives  . Adhesive (Tape) Rash    Use paper tape    No prescriptions prior to admission    ROS Separated. She denies dyspareunia.  Height 5\' 5"  (1.651 m), weight 104.327 kg (230 lb). Physical Exam Heart- rrr Lungs-  CTAB Abd- benign   No results found for this or any previous visit (from the past 24 hour(s)).  No results found.  Assessment/Plan: DUB/menorrhagia/anemia/fibroids- she will have a RATH/BL sapingectomy/cystoscopy. She understands that there are risks of surgery including but not limited to infection, bleeding, damage to bowel, bladder, ureters, DVTs. After further discussion, she would like her ovaries removed. She understands that she may have worse hot flashes and want estrogen replacement. She would like to spend the night and go home tomorrow.  Boyd Buffalo C. 07/14/2012, 8:26 AM

## 2012-07-14 NOTE — Transfer of Care (Signed)
Immediate Anesthesia Transfer of Care Note  Patient: Margaret [name]  Procedure(s) Performed: Procedure(s) (LRB) with comments: HYSTERECTOMY ABDOMINAL (N/A) ROBOTIC ASSISTED LAPAROSCOPIC LYSIS OF ADHESION (N/A)  Patient Location: PACU  Anesthesia Type:General  Level of Consciousness: awake, alert  and oriented  Airway & Oxygen Therapy: Patient Spontanous Breathing and Patient connected to nasal cannula oxygen  Post-op Assessment: Report given to PACU RN and Post -op Vital signs reviewed and stable  Post vital signs: Reviewed and stable  Complications: No apparent anesthesia complications

## 2012-07-14 NOTE — Progress Notes (Signed)
Dr. Marice Potter called and informed that patient has hives with pcn. Per Dr. Marice Potter it is ok to give ancef 2 grams.

## 2012-07-14 NOTE — OR Nursing (Signed)
Attempted Robotic Hysterectomy then converted to Abdominal Hysterectomy

## 2012-07-14 NOTE — Anesthesia Procedure Notes (Signed)
Procedure Name: Intubation Date/Time: 07/14/2012 12:43 PM Performed by: Shanon Payor Pre-anesthesia Checklist: Suction available, Emergency Drugs available, Timeout performed, Patient identified and Patient being monitored Patient Re-evaluated:Patient Re-evaluated prior to inductionOxygen Delivery Method: Circle system utilized Preoxygenation: Pre-oxygenation with 100% oxygen Intubation Type: IV induction and Inhalational induction Ventilation: Mask ventilation without difficulty Laryngoscope Size: Mac and 3 Grade View: Grade III Tube type: Oral Tube size: 7.0 mm Number of attempts: 2 Airway Equipment and Method: Patient positioned with wedge pillow and Stylet Placement Confirmation: ETT inserted through vocal cords under direct vision,  positive ETCO2,  CO2 detector and breath sounds checked- equal and bilateral Secured at: 22 cm Tube secured with: Tape Dental Injury: Teeth and Oropharynx as per pre-operative assessment  Difficulty Due To: Difficulty was anticipated Comments: DL x 1 by CRNA, unable to visualize cords, Mask ventilating easily, DL X 1 by Dr. Jean Rosenthal, intubation successful.

## 2012-07-14 NOTE — Anesthesia Postprocedure Evaluation (Signed)
Anesthesia Post Note  Patient: Margaret [name]  Procedure(s) Performed: Procedure(s) (LRB): HYSTERECTOMY ABDOMINAL (N/A) ROBOTIC ASSISTED LAPAROSCOPIC LYSIS OF ADHESION (N/A)  Anesthesia type: General  Patient location: PACU  Post pain: Pain level controlled  Post assessment: Post-op Vital signs reviewed  Last Vitals:  Filed Vitals:   07/14/12 1630  BP: 147/86  Pulse: 104  Temp:   Resp: 20    Post vital signs: Reviewed  Level of consciousness: sedated  Complications: No apparent anesthesia complications

## 2012-07-14 NOTE — Progress Notes (Signed)
Patient rates her pain a 10/10 but falls asleep while in the middle of conversation.

## 2012-07-14 NOTE — Op Note (Addendum)
07/14/2012  3:34 PM  PATIENT:  Margaret [name]  51 y.o. female  PRE-OPERATIVE DIAGNOSIS:  Dysfunctional Uterine Bleeding, fibroids, anemia  POST-OPERATIVE DIAGNOSIS:  Same plus dense adhesions  PROCEDURE:  Procedure(s) (LRB) with comments: HYSTERECTOMY ABDOMINAL (N/A) ROBOTIC ASSISTED LAPAROSCOPIC LYSIS OF ADHESION (N/A) BILATERAL SAPINGOOPHERECTOMY  SURGEON:  Surgeon(s) and Role:    * Allie Bossier, MD - Primary   PHYSICIAN ASSISTANT:   ASSISTANTS: Elsie Lincoln, MD   ANESTHESIA:   general  EBL:  Total I/O In: 1400 [I.V.:1400] Out: 300 [Urine:200; Blood:100]  FINDINGS: grossly enlarged fibroid uterus, oviducts with evidence of previous tubal ligation, normal appearing ovaries, dense omental adhesions to the anterior abdominal wall, and adhesions of omentum and oviduct to the left  fundus BLOOD ADMINISTERED:none  DRAINS: none   LOCAL MEDICATIONS USED:  OTHER ropivicaine  SPECIMEN:  Source of Specimen:  uterus, tubes, ovaries  DISPOSITION OF SPECIMEN:  PATHOLOGY  COUNTS:  YES  TOURNIQUET:  * No tourniquets in log *  DICTATION: .Dragon Dictation  PLAN OF CARE: Admit to inpatient   PATIENT DISPOSITION:  PACU - hemodynamically stable.   Delay start of Pharmacological VTE agent (>24hrs) due to surgical blood loss or risk of bleeding: not applicable  The risks, benefits, and alternatives of surgery were explained, understood, and accepted. Consents were signed. She was taken to the operating room placed in the dorsal lithotomy position. When she was comfortable general anesthesia was applied without complication. She was prepped and padded appropriately for steep Trendelenburg position. Her abdomen and vagina were prepped and draped in the usual sterile fashion. A bimanual exam revealed a grossly enlarged mobile uterus and nonenlarged adnexa. I placed this weighted speculum and grasped her cervix with a single-tooth tenaculum. I did a paracervical block. I then sounded the  uterus to 12 cm. I progressively and easily dilated the cervix to accommodate a Rumi uterine manipulator. A Foley catheter was placed. Gloves were changed and attention was turned to the abdomen. I injected the umbilicus with a dilute solution of ropivacaine. I then made a vertical incision and placed a varies needle. Low-flow CO2 was used to insufflate the abdomen to approximately 6 liters. Patient abdominal pressure was always less than 15. She was placed in steep Trendelenburg and I inspected the pelvis after placing a 11 Excel mm trocar. There were a large amount of omental adhesions up to the anterior abdominal wall. I placed an 8 mm ports approximately 10 cm to the left of the umbilicus. A 11 mm Excel  Approximately 10 cm to the right of the umbilicus. An 8 mm port was placed in the right lower quadrant under direct laparoscopic visualization.trocar was placed for the system portBy moving the camera past them, I was able to visualize the uterus which had omental adhesions to the left fundus and left side of the uterus. Using the PK device I began taking down the omental adhesions to the abdominal wall. Hemostasis was noted. I then began by taking the adhesions down off of the left side of the uterus. I initially thought that there was bowel near thisand the possibility that I had entered the serosa of the bowel. A a general surgeon, Dr. Georgana Curio, was consulted. By this point the second PK device was not working so I decided to convert to a laparotomy. The robot was undocked and a low-transverse incision was made at the site of her previous scar. The incision was carried down to the subcutaneous tissue and fascia was scored the  midline. Fascial incision was extended bilaterally. The middle 50% of the rectus muscles were separated in transverse fashion using electrosurgical technique. Excellent hemostasis was maintained. The peritoneum was entered with hemostats the peritoneal incision was extended bilaterally  with Bovie to the bowel was packed of abdominal cavity. Dr. Ricky Stabs scrubbed in and remove the adhesions from the left side of the uterus. It was, in fact, not bowel, but the serosa of the oviduct which was grossly distorted. A small amount of chocolate cyst fluid from the ovary was also noted. At this point hysterectomy proceeded easily. 2-0 Vicryl suture is used unless otherwise specified throughout this case. The round ligaments were clamped, cut, and ligated. The infundibulopelvic ligaments were identified bilaterally. The ureters were identified bilaterally. The IP ligaments were clamped, cut, and ligated. Excellent hemostasis was noted on both sides. A bladder flap was created anteriorly. The uterine vessels were identified. There were clamped, cut, and ligated. The cervix was separated from its pelvic attachments with a similar technique. Once the cervix was removed the vaginal cuff was closed with a 0 Vicryl running locking suture. Excellent hemostasis was noted. The pelvis was irrigated with a liter of warm normal saline. All pedicles were hemostatic. The O'Connor-O'Sullivan self-retaining retractor and sponges were removed. The fascia was closed with a #1 PDS loop. No defects were palpable. The subcutaneous tissue was irrigated, clean, and dried. A dermal closure was done with 3-0 Vicryl suture. Steri-Strips were placed across the incision. Her Foley catheter and clear urine throughout the case. She was taken to recovery room in stable condition.

## 2012-07-15 ENCOUNTER — Encounter (HOSPITAL_COMMUNITY): Payer: Self-pay | Admitting: Obstetrics & Gynecology

## 2012-07-15 LAB — CBC
MCH: 24 pg — ABNORMAL LOW (ref 26.0–34.0)
MCV: 78.6 fL (ref 78.0–100.0)
Platelets: 372 10*3/uL (ref 150–400)
RBC: 3.88 MIL/uL (ref 3.87–5.11)
RDW: 16.5 % — ABNORMAL HIGH (ref 11.5–15.5)
WBC: 10.9 10*3/uL — ABNORMAL HIGH (ref 4.0–10.5)

## 2012-07-15 NOTE — Progress Notes (Signed)
UR Chart review completed.  

## 2012-07-15 NOTE — Progress Notes (Signed)
1 Day Post-Op Procedure(s) (LRB): HYSTERECTOMY ABDOMINAL (N/A)/BSO ROBOTIC ASSISTED LAPAROSCOPIC LYSIS OF ADHESION (N/A)  Subjective: Patient reports tolerating PO, + flatus and no problems voiding. Ambulating.  Objective: I have reviewed patient's vital signs, intake and output, medications and labs.  General: alert Resp: clear to auscultation bilaterally Cardio: regular rate and rhythm, S1, S2 normal, no murmur, click, rub or gallop GI: soft, non-tender; bowel sounds normal; no masses,  no organomegaly Dressing: C/D/I Assessment: s/p Procedure(s) (LRB) with comments: HYSTERECTOMY ABDOMINAL (N/A) ROBOTIC ASSISTED LAPAROSCOPIC LYSIS OF ADHESION (N/A): stable and progressing well  Plan: Advance diet Encourage ambulation  LOS: 1 day    Margaret [name] C. 07/15/2012, 10:20 AM

## 2012-07-16 MED ORDER — OXYCODONE-ACETAMINOPHEN 5-325 MG PO TABS: 1.0000 | ORAL_TABLET | ORAL | Status: DC | PRN

## 2012-07-16 MED ORDER — IBUPROFEN 800 MG PO TABS: ORAL_TABLET | ORAL | Status: DC

## 2012-07-16 NOTE — Discharge Summary (Signed)
Physician Discharge Summary  Patient ID: ADYLINE GRANDT MRN: [id number] DOB/AGE: [birth year] 51 y.o.  Admit date: 07/14/2012 Discharge date: 07/16/2012  Admission Diagnoses: symptomatic fibroids, anemia  Discharge Diagnoses: same Active Problems:  * No active hospital problems. *    Discharged Condition: good  Hospital Course: She underwent an uncomplicated TAH/BSO/LOA. Initially, I attempted a RATH, but dense adhesions and equipment failure required that I convert to a laparatomy. Post operatively she did well. Her HBG was stable, she remained afebrile. By POD #2, she voiced her readiness to go home. She was ambulating, voiding, and having flatus, and tolerating po well.  Consults: general surgery (intraoperatively)  Significant Diagnostic Studies: labs: as above  Treatments: surgery: as above  Discharge Exam: Blood pressure 126/70, pulse 74, temperature 98.3 F (36.8 C), temperature source Oral, resp. rate 18, height 5\' 5"  (1.651 m), weight 107.162 kg (236 lb 4 oz), SpO2 98.00%. General appearance: alert Resp: clear to auscultation bilaterally Cardio: regular rate and rhythm, S1, S2 normal, no murmur, click, rub or gallop GI: soft, non-tender; bowel sounds normal; no masses,  no organomegaly Incision/Wound: C/D/I  Disposition: 01-Home or Self Care  Discharge Orders    Future Appointments: Provider: Department: Dept Phone: Center:   08/09/2012 3:15 PM [place] Nurse [facility] [phone] [facility]       Medication List     As of 07/16/2012  8:15 AM    TAKE these medications         DULoxetine 60 MG capsule   Commonly known as: CYMBALTA   Take 60 mg by mouth daily.      ferrous sulfate 325 (65 FE) MG tablet   Take 325 mg by mouth 2 (two) times daily with a meal.      fluticasone 50 MCG/ACT nasal spray   Commonly known as: FLONASE   Place 1 spray into the nose daily as needed. For allergies      ibuprofen 800 MG tablet   Commonly known as: ADVIL,MOTRIN   Take 800 mg by mouth 2 (two) times daily as needed. For pain      ibuprofen 800 MG tablet   Commonly known as: ADVIL,MOTRIN   Take 1 pill round the clock for a week, then prn      oxyCODONE-acetaminophen 5-325 MG per tablet   Commonly known as: PERCOCET/ROXICET   Take 1-2 tablets by mouth every 4 (four) hours as needed (moderate to severe pain (when tolerating fluids)).      simvastatin 40 MG tablet   Commonly known as: ZOCOR   Take 40 mg by mouth at bedtime.      traMADol 50 MG tablet   Commonly known as: ULTRAM   Take 50 mg by mouth every 6 (six) hours as needed. For pain      valsartan-hydrochlorothiazide 320-12.5 MG per tablet   Commonly known as: DIOVAN-HCT   Take 1 tablet by mouth daily.           Follow-up Information    Follow up with Jaymar Loeber C., MD. Schedule an appointment as soon as possible for a visit in 6 weeks.   Contact information:   [address] [place] [address] [phone]          Signed: Allie Bossier 07/16/2012, 8:15 AM

## 2012-07-16 NOTE — Progress Notes (Signed)
Discharge instructions provided to patient at bediside.  Medications, activity instructions, incision care,  follow up appointments and community resources discussed.  No questions at this time.  Patient left unit in stable condition with personal belongings, accompanied by staff member.  Osvaldo Angst, RN---------

## 2012-07-29 ENCOUNTER — Telehealth: Payer: Self-pay | Admitting: *Deleted

## 2012-07-29 NOTE — Telephone Encounter (Signed)
Pt states that she was seen at the [facility] ED yesterday because her incision had opened up a little bit. She was told to call us to see if she needs to be seen to follow up with [place]. They did labs and an ultrasound and told her everything was fine. They put her on antibiotics.

## 2012-07-29 NOTE — Telephone Encounter (Signed)
Pt states that the incision is slightly opened in a few areas and that it is not painful. The doctor [facility] told her that it looked okay and did ultrasound to be sure that she didn't have an abscess. I offered patient to come in and be seen tomorrow morning for an incision check. Pt states that she will watch it for a few days and let us know how things are. She doesn't have any pain or drainage. I advised that if it becomes painful, irritated or develops odor with drainage to let us know. Pt agrees and will call back if she needs to be seen. Also advised her that she keep the area clean and dry.

## 2012-08-09 ENCOUNTER — Ambulatory Visit: Payer: Medicare Other

## 2012-08-18 ENCOUNTER — Ambulatory Visit: Payer: Medicare Other | Admitting: Obstetrics & Gynecology

## 2012-08-18 ENCOUNTER — Encounter: Payer: Self-pay | Admitting: Obstetrics & Gynecology

## 2012-08-18 VITALS — BP 118/78 | HR 84 | Temp 97.2°F | Ht 65.0 in | Wt 238.4 lb

## 2012-08-18 DIAGNOSIS — Z9889 Other specified postprocedural states: Secondary | ICD-10-CM

## 2012-08-18 DIAGNOSIS — Z0000 Encounter for general adult medical examination without abnormal findings: Secondary | ICD-10-CM

## 2012-08-18 NOTE — Progress Notes (Signed)
  Subjective:    Patient ID: Margaret [name], female    DOB: 17-May-1961, 51 y.o.   MRN: [id number]  HPI  51 yo AA lady 5 weeks post op status TAH/BSO. She was seen at the ER in [place] because she thought her incision was opening. (She stays in [place]). Her work up was normal. She does not work and is not sexually active. She reports occasional non-annoying hot flashes and declines treatment.  Review of Systems  Her mammogram was normal 3/13. She declines a flu vaccine.    Objective:   Physical Exam  Well healed incision. 1 cm of granulation tissue. I treated it with silver nitrate. Well-healed vaginal cuff. Normal discharge. Normal bimanual exam      Assessment & Plan:   Post op- stable RTC 1 year Mammogram 4/14

## 2012-08-25 ENCOUNTER — Ambulatory Visit: Payer: 59 | Admitting: Obstetrics & Gynecology

## 2012-09-10 ENCOUNTER — Ambulatory Visit (HOSPITAL_COMMUNITY): Payer: Medicare Other

## 2012-10-23 ENCOUNTER — Other Ambulatory Visit: Payer: Self-pay

## 2012-11-23 ENCOUNTER — Ambulatory Visit (HOSPITAL_COMMUNITY)
Admission: RE | Admit: 2012-11-23 | Discharge: 2012-11-23 | Disposition: A | Discharge status: Home / Self Care | Payer: Medicare HMO | Source: Ambulatory Visit | Attending: Obstetrics & Gynecology | Admitting: Obstetrics & Gynecology

## 2012-11-23 ENCOUNTER — Ambulatory Visit (HOSPITAL_COMMUNITY): Payer: Medicare Other

## 2012-11-23 DIAGNOSIS — Z0000 Encounter for general adult medical examination without abnormal findings: Secondary | ICD-10-CM

## 2012-11-23 DIAGNOSIS — Z1231 Encounter for screening mammogram for malignant neoplasm of breast: Secondary | ICD-10-CM | POA: Insufficient documentation

## 2012-12-02 ENCOUNTER — Ambulatory Visit (HOSPITAL_COMMUNITY): Payer: Medicare Other

## 2012-12-28 ENCOUNTER — Other Ambulatory Visit (HOSPITAL_COMMUNITY): Payer: Self-pay | Admitting: Cardiology

## 2012-12-28 DIAGNOSIS — R079 Chest pain, unspecified: Secondary | ICD-10-CM

## 2012-12-29 ENCOUNTER — Other Ambulatory Visit: Payer: Self-pay | Admitting: *Deleted

## 2012-12-29 DIAGNOSIS — R002 Palpitations: Secondary | ICD-10-CM

## 2013-01-04 ENCOUNTER — Encounter (INDEPENDENT_AMBULATORY_CARE_PROVIDER_SITE_OTHER): Payer: Medicare HMO

## 2013-01-04 ENCOUNTER — Telehealth: Payer: Self-pay | Admitting: *Deleted

## 2013-01-04 DIAGNOSIS — R002 Palpitations: Secondary | ICD-10-CM

## 2013-01-04 NOTE — Telephone Encounter (Signed)
48 hr holter monitor placed on Pt. 01/04/13 TK

## 2013-01-06 ENCOUNTER — Encounter (HOSPITAL_COMMUNITY): Payer: Medicare HMO

## 2013-01-17 ENCOUNTER — Encounter (HOSPITAL_COMMUNITY)
Admission: RE | Admit: 2013-01-17 | Discharge: 2013-01-17 | Disposition: A | Discharge status: Home / Self Care | Payer: Medicare HMO | Source: Ambulatory Visit | Attending: Cardiology | Admitting: Cardiology

## 2013-01-17 ENCOUNTER — Other Ambulatory Visit: Payer: Self-pay

## 2013-01-17 DIAGNOSIS — R079 Chest pain, unspecified: Secondary | ICD-10-CM

## 2013-01-17 DIAGNOSIS — R002 Palpitations: Secondary | ICD-10-CM | POA: Insufficient documentation

## 2013-01-17 DIAGNOSIS — R0602 Shortness of breath: Secondary | ICD-10-CM | POA: Insufficient documentation

## 2013-01-17 MED ORDER — REGADENOSON 0.4 MG/5ML IV SOLN
0.4000 mg | Freq: Once | INTRAVENOUS | Status: AC
  Administered 2013-01-17: 0.4 mg via INTRAVENOUS

## 2013-01-17 MED ORDER — TECHNETIUM TC 99M SESTAMIBI GENERIC - CARDIOLITE
30.0000 | Freq: Once | INTRAVENOUS | Status: AC | PRN
  Administered 2013-01-17: 30 via INTRAVENOUS

## 2013-01-17 MED ORDER — TECHNETIUM TC 99M SESTAMIBI GENERIC - CARDIOLITE
10.0000 | Freq: Once | INTRAVENOUS | Status: AC | PRN
  Administered 2013-01-17: 10 via INTRAVENOUS

## 2013-01-17 MED ORDER — REGADENOSON 0.4 MG/5ML IV SOLN
INTRAVENOUS | Status: AC
  Filled 2013-01-17: qty 5

## 2013-02-08 ENCOUNTER — Telehealth: Payer: Self-pay | Admitting: *Deleted

## 2013-02-08 NOTE — Telephone Encounter (Signed)
LMTCB RE MONITOR RESULTS  PER DR NISHAN  NSR PAC'S LONGEST CAN SEE BEATS 4-5  NO CLINICALLY  SIGNIFICANT ARRYTHMIA./CY

## 2013-02-17 ENCOUNTER — Telehealth: Payer: Self-pay | Admitting: *Deleted

## 2013-02-17 ENCOUNTER — Encounter: Payer: Self-pay | Admitting: Obstetrics & Gynecology

## 2013-02-17 NOTE — Telephone Encounter (Signed)
PT  AWARE OF  MONITOR RESULTS ./CY 

## 2013-02-17 NOTE — Telephone Encounter (Signed)
Pt left message stating that she has appt scheduled tomorrow. She went to [facility] for her sx and was given some medication. She wants to know if she still needs to keep clinic appt as scheduled.  I returned pt's call and she stated that she was given medication to treat a vaginal yeast infection. She is feeling much better. I stated that I did not think she would need her clinic appt tomorrow unless she is having other problems. She then told me that she has been having some pain and feeling a "knot" at the right side of her abdomen. She had hysterectomy on 07/14/12. I told pt that she could keep her appt for tomorrow and she will be examined by our PA or she could re-schedule the appt with Dr. Marice Potter at a later time. Pt prefers to re-schedule with Dr. Marice Potter since she performed the surgery. I sent message to scheduling staff and pt will be called with appt details. She voiced understanding of all information given.

## 2013-02-18 ENCOUNTER — Ambulatory Visit: Payer: Medicare HMO | Admitting: Medical

## 2013-03-23 ENCOUNTER — Ambulatory Visit (INDEPENDENT_AMBULATORY_CARE_PROVIDER_SITE_OTHER): Payer: Medicare HMO | Admitting: Obstetrics & Gynecology

## 2013-03-23 ENCOUNTER — Encounter: Payer: Self-pay | Admitting: Obstetrics & Gynecology

## 2013-03-23 VITALS — BP 126/83 | HR 103 | Temp 98.2°F | Ht 65.0 in | Wt 241.7 lb

## 2013-03-23 DIAGNOSIS — R209 Unspecified disturbances of skin sensation: Secondary | ICD-10-CM

## 2013-03-23 DIAGNOSIS — L7682 Other postprocedural complications of skin and subcutaneous tissue: Secondary | ICD-10-CM

## 2013-03-23 DIAGNOSIS — N898 Other specified noninflammatory disorders of vagina: Secondary | ICD-10-CM

## 2013-03-23 MED ORDER — FLUCONAZOLE 150 MG PO TABS: 150.0000 mg | ORAL_TABLET | Freq: Once | ORAL | Status: DC

## 2013-03-23 NOTE — Progress Notes (Signed)
  Subjective:    Patient ID: Margaret [name], female    DOB: 04-06-1961, 52 y.o.   MRN: [id number]  HPI  Margaret [name] is a 52 yo lady now about 10 months s/p TAH/BSO (third transverse incision). She comes in today with 2 issues. The first is that of a "knot" near the right edge of her incision. She reports that it is about 2-3 times per week and intermittent. She takes IBU 800 mg TID for her back pain and says that her incisional pain is not helped with the IBU. She denies any provocative or palliative maneuvers and describes the pain as "coming and going".  Her second complaint is that of a vaginal discharge. She thinks this is similar to when she was treated for a yeast infection.   Review of Systems     Objective:   Physical Exam  There is a small questionable area about 1 cm above the right edge of her incision. It is about 1 cm x 0.5cm.  Copious white discharge c/w BV and also curdy discharge c/w yeast        Assessment & Plan:  Incisional pain and ?knot- I have offered her an u/s but have stressed that I'm not sure that anything will show up. I have tried to reassure her that this is not a life-threatening issue. Vaginal discharge- treat with difluclan now and await wet prep

## 2013-03-24 LAB — WET PREP, GENITAL

## 2013-03-28 ENCOUNTER — Ambulatory Visit (HOSPITAL_COMMUNITY): Admission: RE | Admit: 2013-03-28 | Payer: Medicare HMO | Source: Ambulatory Visit

## 2013-03-30 ENCOUNTER — Other Ambulatory Visit: Payer: Self-pay | Admitting: Obstetrics & Gynecology

## 2013-03-30 ENCOUNTER — Ambulatory Visit (HOSPITAL_COMMUNITY)
Admission: RE | Admit: 2013-03-30 | Discharge: 2013-03-30 | Disposition: A | Discharge status: Home / Self Care | Payer: Medicare HMO | Source: Ambulatory Visit | Attending: Obstetrics & Gynecology | Admitting: Obstetrics & Gynecology

## 2013-03-30 DIAGNOSIS — Z90710 Acquired absence of both cervix and uterus: Secondary | ICD-10-CM | POA: Insufficient documentation

## 2013-03-30 DIAGNOSIS — L7682 Other postprocedural complications of skin and subcutaneous tissue: Secondary | ICD-10-CM

## 2013-03-30 DIAGNOSIS — R209 Unspecified disturbances of skin sensation: Secondary | ICD-10-CM | POA: Insufficient documentation

## 2013-04-27 ENCOUNTER — Ambulatory Visit (INDEPENDENT_AMBULATORY_CARE_PROVIDER_SITE_OTHER): Payer: Medicare HMO | Admitting: Obstetrics & Gynecology

## 2013-04-27 ENCOUNTER — Encounter: Payer: Self-pay | Admitting: Obstetrics & Gynecology

## 2013-04-27 VITALS — BP 124/88 | HR 86 | Temp 97.2°F | Ht 65.0 in | Wt 240.4 lb

## 2013-04-27 DIAGNOSIS — B372 Candidiasis of skin and nail: Secondary | ICD-10-CM

## 2013-04-27 MED ORDER — FLUCONAZOLE 150 MG PO TABS: ORAL_TABLET | ORAL | Status: DC

## 2013-04-27 NOTE — Progress Notes (Signed)
  Subjective:    Patient ID: Margaret [name], female    DOB: 1961/07/26, 52 y.o.   MRN: [id number]  HPI  52 yo lady who is here with the complaint of a possible repeat of her yeast infection. The incisional "knot" has resolved since her last visit.  Review of Systems     Objective:   Physical Exam   Diaper rash type distribution of a loss of pigmentation in her bilateral groins     Assessment & Plan:  Yeast vaginitis- diflucan

## 2013-06-22 ENCOUNTER — Telehealth: Payer: Self-pay

## 2013-06-22 NOTE — Telephone Encounter (Signed)
Pt called and stated that she has another yeast infection and that this is the 3rd incident in 2 months.   Called pt and left message to return call to the clinics.

## 2013-06-23 NOTE — Telephone Encounter (Signed)
Sent to front office

## 2013-06-23 NOTE — Telephone Encounter (Signed)
Margaret [name] called and left a message she is returning [name]'s call.  Called [name] and she reports she is having the same symptoms she had when she saw Dr. Marice Potter in August for yeast dermatitis.  States she took the diflucan 1 every other day x3 in August and symptoms resolved, then again September when symptoms returned. States the diflucan helped, but now symptoms returned-c/o burning in perineal area and itching.  We discussed she could try terazol 3 or 7 otc to see if that could give her better relief. I also instructed her to make an appointment to be seen in November as she should not have yeast reccuring so often- she states she is a borderline diabetic. Will have front office call her with an appointment

## 2013-07-13 ENCOUNTER — Ambulatory Visit: Payer: Medicare HMO | Admitting: Obstetrics & Gynecology

## 2013-07-14 ENCOUNTER — Other Ambulatory Visit: Payer: Self-pay

## 2013-12-06 ENCOUNTER — Other Ambulatory Visit: Payer: Self-pay | Admitting: Obstetrics & Gynecology

## 2013-12-06 DIAGNOSIS — Z1231 Encounter for screening mammogram for malignant neoplasm of breast: Secondary | ICD-10-CM

## 2013-12-14 ENCOUNTER — Ambulatory Visit (HOSPITAL_COMMUNITY): Payer: Medicare HMO

## 2014-01-09 ENCOUNTER — Ambulatory Visit (HOSPITAL_COMMUNITY)
Admission: RE | Admit: 2014-01-09 | Discharge: 2014-01-09 | Disposition: A | Discharge status: Home / Self Care | Payer: Medicare HMO | Source: Ambulatory Visit | Attending: Obstetrics & Gynecology | Admitting: Obstetrics & Gynecology

## 2014-01-09 DIAGNOSIS — Z1231 Encounter for screening mammogram for malignant neoplasm of breast: Secondary | ICD-10-CM

## 2014-03-23 ENCOUNTER — Encounter: Payer: Self-pay | Admitting: Dietician

## 2014-03-23 ENCOUNTER — Encounter: Payer: Medicare HMO | Attending: Cardiology | Admitting: Dietician

## 2014-03-23 VITALS — Ht 65.0 in | Wt 261.0 lb

## 2014-03-23 DIAGNOSIS — E119 Type 2 diabetes mellitus without complications: Secondary | ICD-10-CM | POA: Diagnosis present

## 2014-03-23 NOTE — Patient Instructions (Signed)
Goals:  Follow Diabetes Meal Plan as instructed  Eat 3 meals and 2 snacks, every 3-5 hrs  Limit carbohydrate intake to 30-45 grams carbohydrate/meal  Limit carbohydrate intake to 15-30 grams carbohydrate/snack  Add lean protein foods to meals/snacks  Monitor glucose levels as instructed by your doctor  Aim for 30-60 mins of physical activity daily  Bring food record and glucose log to your next nutrition visit

## 2014-03-23 NOTE — Progress Notes (Signed)
  Medical Nutrition Therapy:  Appt start time: 0930 end time:  1030.   Assessment:  Primary concerns today: Margaret [name] is here today for diabetes self-management.  She was diagnosed about two months ago with a HgA1C of 7.2% on 01/18/2014.  She is checking her blood sugar at least once a day, up to 3 times a day, especially if she is exercising.  Her BG log ranged from 87-163.    Preferred Learning Style all of the following:  Auditory  Visual  Hands on  Learning Readiness:  Ready  MEDICATIONS: see list  Usual physical activity: [employer] for 60 min 3-4 days a week doing cardio and light weight training  Estimated energy needs: 1500 calories 170 g carbohydrates  Progress Towards Goal(s):  In progress.   Nutritional Diagnosis:  NI-5.8.2 Excessive carbohydrate intake As related to sugary beverage intake and frequent consumption of starchy foods.  As evidenced by patient's usual intake report and new diagnoses of diabetes with HgA1c of 7.2%..    Intervention:  Nutrition education and counseling on managing diabetes and blood glucose with diet, lifestyle, and exercise.  Discussed the physiology of diabetes and how glucose and insulin work within the body.  Discussed carbohydrates as our body's fuel and that our objective is to manage our portion sizes of these foods and spread them evenly across our day so that our blood sugar does not spike when we eat too much at one time.  Discussed what the HgA1c test is and that a normal goal is to be at 7% or lower to reduce risk of long-term complications.  Encouraged Favor that at 7.2% she can reach this goal with lifestyle changes and improved habits and explained that weight loss will improve her numbers too.  Discussed what to do in the event of a low blood sugar by treating with the rule of 15.  Outlined diabetes' relationship with heart health and discussed sodium recommendations of 2300 mg or less per day and choosing lean protein options with less  saturated fat.  Neveah was very engaged and had lots of good questions.  Praised her for making these changes a priority in her life.  Goals:  Follow Diabetes Meal Plan as instructed  Eat 3 meals and 2 snacks, every 3-5 hrs  Limit carbohydrate intake to 30-45 grams carbohydrate/meal  Limit carbohydrate intake to 15-30 grams carbohydrate/snack  Add lean protein foods to meals/snacks  Monitor glucose levels as instructed by your doctor  Aim for 30-60 mins of physical activity daily  Bring food record and glucose log to your next nutrition visit  Teaching Method Utilized all of the following: Visual Auditory Hands on  Handouts given during visit include:  Living Well with Diabetes  Yellow Card Meal Planner  Nutrition Fact Label  Sodium Free Flavoring Tips  Affordable Exercise in [place]  Barriers to learning/adherence to lifestyle change: financial barrier, she is on food stamps for about $17.00 a day  Demonstrated degree of understanding via:  Teach Back   Monitoring/Evaluation:  Dietary intake, exercise, portion control, carb counting, and body weight in 3 month(s).

## 2014-04-12 ENCOUNTER — Ambulatory Visit: Payer: Self-pay | Admitting: Podiatrist

## 2014-06-13 ENCOUNTER — Encounter: Payer: Self-pay | Admitting: Podiatry

## 2014-06-13 ENCOUNTER — Ambulatory Visit: Payer: Self-pay | Admitting: Podiatry

## 2014-06-13 ENCOUNTER — Ambulatory Visit (INDEPENDENT_AMBULATORY_CARE_PROVIDER_SITE_OTHER): Payer: Medicare HMO

## 2014-06-13 ENCOUNTER — Ambulatory Visit (INDEPENDENT_AMBULATORY_CARE_PROVIDER_SITE_OTHER): Payer: Medicare HMO | Admitting: Podiatry

## 2014-06-13 VITALS — BP 114/74 | HR 115 | Resp 16 | Ht 65.0 in | Wt 271.0 lb

## 2014-06-13 DIAGNOSIS — Q6650 Congenital pes planus, unspecified foot: Secondary | ICD-10-CM

## 2014-06-13 DIAGNOSIS — E1140 Type 2 diabetes mellitus with diabetic neuropathy, unspecified: Secondary | ICD-10-CM

## 2014-06-13 DIAGNOSIS — M2010 Hallux valgus (acquired), unspecified foot: Secondary | ICD-10-CM

## 2014-06-13 DIAGNOSIS — M722 Plantar fascial fibromatosis: Secondary | ICD-10-CM

## 2014-06-13 NOTE — Progress Notes (Signed)
   Subjective:    Patient ID: Margaret [name], female    DOB: 08-Dec-1960, 53 y.o.   MRN: [id number]  HPI Comments: Pain in the right foot now for a couple of months, it is a sharp throbbing , tingling sensation. Walk on the outside of the foot.  Sees a dr at [facility] . Diabetic just found out in June. Last a1c 7.2   Foot Pain      Review of Systems  Endocrine:       Diabetes  All other systems reviewed and are negative.      Objective:   Physical Exam: I have reviewed her past medical history medications allergies surgeries social history and review of systems. Pulses are strongly palpable bilateral. Neurologic sensorium is intact per Semmes-Weinstein monofilament. Deep tendon reflexes are intact bilateral muscle strength is 5 over 5 dorsiflexors plantar flexors inverters everters all intrinsic musculature is intact. Orthopedic evaluation demonstrates flexible pes planus bilateral severe hallux abductovalgus deformity bilaterally. She has mild tenderness on palpation of the medial calcaneal tubercles bilaterally no pain on medial and lateral compression of the calcaneus. She has tenderness on forced eversion of the subtalar joint particularly overlying the sinus tarsi of the right foot. She also has tenderness on end range of motion of the second metatarsophalangeal joint of the right foot. Radiographic evaluation does demonstrate moderate to severe pes planus bilateral severe hallux abductovalgus deformity bilateral soft tissue increase in density at the plantar fascial calcaneal insertion site bilateral and osteoarthritis of the second metatarsophalangeal joint of the right foot. Cutaneous evaluation demonstrates supple well hydrated cutis dorsal aspect of the foot. Plantar aspect of the foot appears to be dry and calluses.        Assessment & Plan:  Assessment: Pes planus with hallux abductovalgus deformity bilateral. Plantar fasciitis right. Osteoarthritis second  metatarsophalangeal joint right. His subtalar joint capsulitis right over left.  Plan: Discussed etiology pathology conservative versus surgical therapies. I offered her injection however she is allergic to cortisone. At this point the only other treatment plan would be surgical or orthotics but she cannot afford. She we'll consider surgical intervention at later day after she has completed the spring semester.

## 2014-06-22 ENCOUNTER — Encounter: Payer: Self-pay | Admitting: Dietician

## 2014-06-22 ENCOUNTER — Encounter: Payer: Medicare HMO | Attending: Cardiology | Admitting: Dietician

## 2014-06-22 DIAGNOSIS — Z6841 Body Mass Index (BMI) 40.0 and over, adult: Secondary | ICD-10-CM | POA: Insufficient documentation

## 2014-06-22 DIAGNOSIS — E119 Type 2 diabetes mellitus without complications: Secondary | ICD-10-CM

## 2014-06-22 DIAGNOSIS — Z713 Dietary counseling and surveillance: Secondary | ICD-10-CM | POA: Insufficient documentation

## 2014-06-22 NOTE — Progress Notes (Signed)
  Medical Nutrition Therapy:  Appt start time: 1330 end time:  1400.  Assessment:  Primary concerns today: Margaret [name] is here today for follow-up for diabetes self-management.  She has gained 9 lbs since our last visit.  She reports she has not been consistent with her exercise.  Her daughter and grandkids have moved out and her neighbors are supportive of her and will walk with her, but they also can be bad influences (smoking, drinking).  She goes to school Mon, [place], and Fri.  She is checking her BG once a day and keeping a log. No updated HgA1c since last visit.   Number of days blood glucose is >200: 8 times in the last 2 months Last MD appointment for diabetes: last week Confidence with ability to manage diabetes: feeling discouraged Areas for improvement with diabetes self-care: weight loss Willingness to participate in diabetes support group: lives in [place]: see list. Started on [place] last week  Usual physical activity: walking 15 min some days, not consistently  Intervention: Nutrition counseling for diabetes and weight management.  Motivational interviewing to encourage exercise habits. Provided snacking recommendations and brainstormed with patient ways to overcome mindless snacking.  Patient plans to identify times/places/activities that cue her snacking habit.  Stated importance of getting in touch with hunger and fullness cues to eliminate snacking from emotions, boredom, habit, etc.  Discussed some weight loss strategies.  Recommended patient choose 1-2 goals to work on for the next 2-3 weeks, and then choose 1-2 more strategies.  Praised her for making her health a priority and encouraged her to set up an exercise schedule, as she commented that this keeps her more accountable.    Teaching Method Utilized all of the following: Visual Auditory  Handouts given during visit include:  Low Carb Snack Suggestions  Nutrition Strategies for Weight Loss  Barriers  to learning/adherence to lifestyle change: financial barrier, she is on food stamps for about $17.00 a day  Demonstrated degree of understanding via:  Teach Back   Monitoring/Evaluation:  Dietary intake, exercise, portion control, carb counting, and body weight in 6 week(s).

## 2014-07-10 ENCOUNTER — Encounter: Payer: Self-pay | Admitting: Dietician

## 2014-08-10 ENCOUNTER — Ambulatory Visit: Payer: Medicare HMO | Admitting: Dietician

## 2014-09-15 ENCOUNTER — Ambulatory Visit: Payer: Medicare HMO | Admitting: Dietician

## 2014-10-04 ENCOUNTER — Encounter: Payer: Self-pay | Admitting: Obstetrics & Gynecology

## 2014-10-04 ENCOUNTER — Ambulatory Visit (INDEPENDENT_AMBULATORY_CARE_PROVIDER_SITE_OTHER): Payer: Medicare HMO | Admitting: Obstetrics & Gynecology

## 2014-10-04 VITALS — BP 122/70 | HR 105 | Ht 65.0 in | Wt 286.0 lb

## 2014-10-04 DIAGNOSIS — Z0000 Encounter for general adult medical examination without abnormal findings: Secondary | ICD-10-CM

## 2014-10-04 DIAGNOSIS — N898 Other specified noninflammatory disorders of vagina: Secondary | ICD-10-CM

## 2014-10-04 DIAGNOSIS — Z01419 Encounter for gynecological examination (general) (routine) without abnormal findings: Secondary | ICD-10-CM

## 2014-10-04 MED ORDER — METRONIDAZOLE 500 MG PO TABS: 500.0000 mg | ORAL_TABLET | Freq: Two times a day (BID) | ORAL | Status: DC

## 2014-10-04 NOTE — Progress Notes (Signed)
Patient states she had a colonoscopy before around 2007. Per chart review verified had 2visits with [facility]- Dr. Olevia Perches.  Called [facility] to schedule visit for colonoscopy- they stated per Dr. Diona Browner notes she is not due for a colonoscopy until May 2017 unless she is having any problems. Margaret [name] denies any GI problems and understands she is due for her next colonscopy 01/2016.

## 2014-10-04 NOTE — Progress Notes (Signed)
Subjective:    Margaret [name] is a 54 y.o. S AA P2 female who presents for an annual exam. The patient has no complaints today. The patient is not currently sexually active. GYN screening history: last pap: was normal. The patient wears seatbelts: yes. The patient participates in regular exercise: no. Has the patient ever been transfused or tattooed?: not asked. The patient reports that there is not domestic violence in her life.   Menstrual History: OB History    Gravida Para Term Preterm AB TAB SAB Ectopic Multiple Living   2 2 2  0 0 0 0 0 0 2      Menarche age: 31  Patient's last menstrual period was 03/30/2011.    The following portions of the patient's history were reviewed and updated as appropriate: allergies, current medications, past family history, past medical history, past social history, past surgical history and problem list.  Review of Systems A comprehensive review of systems was negative. She declines a flu vaccine. Her mammogram is UTD. Her colonoscopy is due.   Objective:    BP 122/70 mmHg  Pulse 105  Ht 5\' 5"  (1.651 m)  Wt 286 lb (129.729 kg)  BMI 47.59 kg/m2  LMP 03/30/2011  General Appearance:    Alert, cooperative, no distress, appears stated age  Head:    Normocephalic, without obvious abnormality, atraumatic  Eyes:    PERRL, conjunctiva/corneas clear, EOM's intact, fundi    benign, both eyes  Ears:    Normal TM's and external ear canals, both ears  Nose:   Nares normal, septum midline, mucosa normal, no drainage    or sinus tenderness  Throat:   Lips, mucosa, and tongue normal; teeth and gums normal  Neck:   Supple, symmetrical, trachea midline, no adenopathy;    thyroid:  no enlargement/tenderness/nodules; no carotid   bruit or JVD  Back:     Symmetric, no curvature, ROM normal, no CVA tenderness  Lungs:     Clear to auscultation bilaterally, respirations unlabored  Chest Wall:    No tenderness or deformity   Heart:    Regular rate and rhythm, S1 and  S2 normal, no murmur, rub   or gallop  Breast Exam:    No tenderness, masses, or nipple abnormality  Abdomen:     Soft, non-tender, bowel sounds active all four quadrants,    no masses, no organomegaly  Genitalia:    Normal female without lesion, discharge or tenderness, vaginal discharge c/w BV. No masses on vaginal exam.     Extremities:   Extremities normal, atraumatic, no cyanosis or edema  Pulses:   2+ and symmetric all extremities  Skin:   Skin color, texture, turgor normal, no rashes or lesions  Lymph nodes:   Cervical, supraclavicular, and axillary nodes normal  Neurologic:   CNII-XII intact, normal strength, sensation and reflexes    throughout  .    Assessment:    Healthy female exam.   Probable BV   Plan:     Breast self exam technique reviewed and patient encouraged to perform self-exam monthly. GI referral for colonoscopy   flagyl

## 2014-10-13 ENCOUNTER — Encounter: Payer: Medicare HMO | Attending: Cardiology | Admitting: Dietician

## 2014-10-13 DIAGNOSIS — E119 Type 2 diabetes mellitus without complications: Secondary | ICD-10-CM | POA: Diagnosis not present

## 2014-10-13 DIAGNOSIS — Z713 Dietary counseling and surveillance: Secondary | ICD-10-CM | POA: Insufficient documentation

## 2014-10-13 DIAGNOSIS — Z6841 Body Mass Index (BMI) 40.0 and over, adult: Secondary | ICD-10-CM | POA: Diagnosis not present

## 2014-10-13 NOTE — Progress Notes (Signed)
  Medical Nutrition Therapy:  Appt start time: 1000 end time:  1030.  Assessment:  Primary concerns today: Margaret [name] is here today for follow-up for diabetes self-management and wt mgmt.  She has gained 16 lbs since our last visit.  She has history of depression and reports that this has really "messed up" her eating habits recently.  She is attending group therapy sessions for depression.  She is checking her BG once a day and keeping a log. She has also begun keeping a food log of her intake.  No updated HgA1c since last visit.  She states that diabetes scares her and that she tries to "cut back" on her eating but then her snacking and choices at school continue to "be a problem" for her.    Usual physical activity: not currently.  Still a member of the [employer].  Intervention: Nutrition counseling for diabetes and weight management.  Motivational interviewing to encourage healthy eating and exercise habits. Discussed using her food log to look back at eating trends with a curiosity of why she eats a certain way at certain times/places, etc.  Encouraged this to be a learning tool, not to be associated with guilt or feelings of "I did 'good' or I did 'bad'."  Encouraged follow up for counseling for her depression after group meetings end.  Stated relationship between depression, stress, sleep, emotional eating, and weight management.  Patient states she wants to start walking again because it made her feel good.  She chose the following goals to work on for the next few weeks: Goals: Cut back on sweets like snickers, candy, muffins.  Choose instead Belvita breakfast cookies, mixed nuts, or fruit snacks.  Start walking in the morning at 9am at the [facility] and walk for 15 min and do machines for an hour two days a week. Keep recording food intake in my log.  Barriers to learning/adherence to lifestyle change: financial barrier, she is on food stamps for about $17.00 a day  Demonstrated degree of understanding via:   Teach Back   Monitoring/Evaluation:  Dietary intake, exercise, portion control, carb counting, and body weight in 6 week(s).

## 2014-10-19 ENCOUNTER — Telehealth: Payer: Self-pay

## 2014-10-19 NOTE — Telephone Encounter (Signed)
Patient called stating she has concerns about a light discharge that she has had after taking ABX for BV. Called patient and discussed discharge-- patient reports thin milky discharge, denies any itching or irritation. Informed patient discharge sounds normal and would not treat it at this time. Patient asked about correct soap to use for hygiene-- informed patient that using unscented soaps was the best method of vaginal cleaning/hygiene. Patient verbalized understanding and gratitude. No further questions or concerns.

## 2015-01-08 ENCOUNTER — Other Ambulatory Visit: Payer: Self-pay | Admitting: Obstetrics & Gynecology

## 2015-01-08 DIAGNOSIS — Z1231 Encounter for screening mammogram for malignant neoplasm of breast: Secondary | ICD-10-CM

## 2015-01-11 ENCOUNTER — Ambulatory Visit (HOSPITAL_COMMUNITY)
Admission: RE | Admit: 2015-01-11 | Discharge: 2015-01-11 | Disposition: A | Discharge status: Home / Self Care | Payer: Medicare HMO | Source: Ambulatory Visit | Attending: Obstetrics & Gynecology | Admitting: Obstetrics & Gynecology

## 2015-01-11 DIAGNOSIS — Z1231 Encounter for screening mammogram for malignant neoplasm of breast: Secondary | ICD-10-CM

## 2015-01-12 ENCOUNTER — Ambulatory Visit: Payer: Medicare HMO | Admitting: Dietician

## 2015-01-19 ENCOUNTER — Encounter: Payer: Self-pay | Admitting: Dietician

## 2015-01-19 ENCOUNTER — Encounter: Payer: Medicare HMO | Attending: Cardiology | Admitting: Dietician

## 2015-01-19 DIAGNOSIS — E119 Type 2 diabetes mellitus without complications: Secondary | ICD-10-CM | POA: Insufficient documentation

## 2015-01-19 DIAGNOSIS — Z713 Dietary counseling and surveillance: Secondary | ICD-10-CM | POA: Insufficient documentation

## 2015-01-19 DIAGNOSIS — Z6841 Body Mass Index (BMI) 40.0 and over, adult: Secondary | ICD-10-CM | POA: Insufficient documentation

## 2015-01-19 NOTE — Patient Instructions (Signed)
Start pool exercises again 2 days a week at the [facility] for 30-45 min. Start tracking your food intake again, keeping a food journal.  Bring in a 3-4 day food journal to your next nutrition visit. Keep up the great work with drinking water.

## 2015-01-19 NOTE — Progress Notes (Signed)
  Medical Nutrition Therapy:  Appt start time: 1000 end time:  1030.  Assessment:  Primary concerns today: follow up for diabetes management and obesity.  She continues to gain weight, up another 5 lbs in the last 3 months.  Weight gain likely in relation to her depression and possibly associated medications.  She was recently diagnosed with fibromyalgia in March.  She is out of school for the summer and has more time in her routine for exercise now.  She has made some changes to her diet but has not been consistent with them and continues to face financial barriers to food selection as well.  Dietary Intake: B - raisin bran with whole milk L - salads with french or vinagrette dressing, water D - peanut butter banana sandwich on whole wheat, water Bev - about 48 oz water/day, 1 cup of apple or cranberry or crangrape juice daily  Physical Activity: was doing physical therapy for fibromyalgia in the pool for 1 month. Contemplating starting pool exercises again at the [facility] 2 days a week for 30-45 min.  Intervention: Nutrition education and counseling.  Patient Instructions: Start pool exercises again 2 days a week at the [facility] for 30-45 min. Start tracking your food intake again, keeping a food journal.  Bring in a 3-4 day food journal to your next nutrition visit. Keep up the great work with drinking water.  Barriers to learning/adherence to lifestyle change: financial barrier, she is on food stamps for about $17.00 a day  Demonstrated degree of understanding via:  Teach Back   Monitoring/Evaluation:  Dietary intake, exercise, and body weight in 6 week(s).

## 2015-03-22 ENCOUNTER — Ambulatory Visit: Payer: Medicare HMO | Admitting: Dietician

## 2015-04-12 ENCOUNTER — Ambulatory Visit: Payer: Medicare HMO | Admitting: Dietician

## 2015-12-31 ENCOUNTER — Telehealth: Payer: Self-pay | Admitting: Hematology

## 2015-12-31 NOTE — Telephone Encounter (Signed)
Spoke with pt regarding her referral; pt selected to stay in [place] due to transportation issues.  Contact referring office and made them aware of pts decision.

## 2016-01-03 ENCOUNTER — Encounter: Payer: Self-pay | Admitting: Gastroenterology

## 2016-01-30 ENCOUNTER — Other Ambulatory Visit: Payer: Self-pay

## 2016-01-30 DIAGNOSIS — Z1231 Encounter for screening mammogram for malignant neoplasm of breast: Secondary | ICD-10-CM

## 2016-02-07 ENCOUNTER — Ambulatory Visit: Payer: Medicare HMO

## 2016-02-15 ENCOUNTER — Ambulatory Visit
Admission: RE | Admit: 2016-02-15 | Discharge: 2016-02-15 | Disposition: A | Discharge status: Home / Self Care | Payer: Medicare HMO | Source: Ambulatory Visit

## 2016-02-15 DIAGNOSIS — Z1231 Encounter for screening mammogram for malignant neoplasm of breast: Secondary | ICD-10-CM

## 2016-09-05 ENCOUNTER — Ambulatory Visit: Payer: Medicare HMO | Admitting: Obstetrics & Gynecology

## 2016-09-19 ENCOUNTER — Ambulatory Visit: Payer: Medicare HMO | Admitting: Obstetrics & Gynecology

## 2016-09-22 DIAGNOSIS — R69 Illness, unspecified: Secondary | ICD-10-CM | POA: Diagnosis not present

## 2016-09-23 DIAGNOSIS — R69 Illness, unspecified: Secondary | ICD-10-CM | POA: Diagnosis not present

## 2016-09-23 DIAGNOSIS — F410 Panic disorder [episodic paroxysmal anxiety] without agoraphobia: Secondary | ICD-10-CM | POA: Diagnosis not present

## 2016-10-03 ENCOUNTER — Ambulatory Visit (INDEPENDENT_AMBULATORY_CARE_PROVIDER_SITE_OTHER): Payer: Medicare HMO | Admitting: Obstetrics & Gynecology

## 2016-10-03 ENCOUNTER — Encounter: Payer: Self-pay | Admitting: Obstetrics & Gynecology

## 2016-10-03 ENCOUNTER — Other Ambulatory Visit (HOSPITAL_COMMUNITY)
Admission: RE | Admit: 2016-10-03 | Discharge: 2016-10-03 | Disposition: A | Discharge status: Home / Self Care | Payer: Medicare HMO | Source: Ambulatory Visit | Attending: Obstetrics & Gynecology | Admitting: Obstetrics & Gynecology

## 2016-10-03 VITALS — BP 125/82 | HR 89 | Wt 237.7 lb

## 2016-10-03 DIAGNOSIS — Z0000 Encounter for general adult medical examination without abnormal findings: Secondary | ICD-10-CM | POA: Diagnosis not present

## 2016-10-03 DIAGNOSIS — K629 Disease of anus and rectum, unspecified: Secondary | ICD-10-CM | POA: Insufficient documentation

## 2016-10-03 DIAGNOSIS — N898 Other specified noninflammatory disorders of vagina: Secondary | ICD-10-CM

## 2016-10-03 DIAGNOSIS — Z01419 Encounter for gynecological examination (general) (routine) without abnormal findings: Secondary | ICD-10-CM | POA: Diagnosis not present

## 2016-10-03 DIAGNOSIS — Z1231 Encounter for screening mammogram for malignant neoplasm of breast: Secondary | ICD-10-CM

## 2016-10-03 NOTE — Progress Notes (Signed)
Subjective:    Margaret [name] is a 56 y.o. separated AA P2 (4 and 30 yo kids) female who presents for an annual exam. The patient has no complaints today except for some vaginal discharge, no color, unsure of what the smell is. She also complains of a sore place near her anus.The patient is not currently sexually active. GYN screening history: last pap: was normal. The patient wears seatbelts: yes. The patient participates in regular exercise: no. Has the patient ever been transfused or tattooed?: yes. The patient reports that there is not domestic violence in her life.   Menstrual History: OB History    Gravida Para Term Preterm AB Living   2 2 2  0 0 2   SAB TAB Ectopic Multiple Live Births   0 0 0 0        Menarche age: 31 Patient's last menstrual period was 03/30/2011.    The following portions of the patient's history were reviewed and updated as appropriate: allergies, current medications, past family history, past medical history, past social history, past surgical history and problem list.  Review of Systems Pertinent items are noted in HPI.   On disability for depression Her mammo UTD She had a colonoscopy about 10 years ago, had 1 scheduled for 5/17 but skipped the appt.   Objective:    BP 125/82   Pulse 89   Wt 237 lb 11.2 oz (107.8 kg)   LMP 03/30/2011   BMI 39.56 kg/m   General Appearance:    Alert, cooperative, no distress, appears stated age  Head:    Normocephalic, without obvious abnormality, atraumatic  Eyes:    PERRL, conjunctiva/corneas clear, EOM's intact, fundi    benign, both eyes  Ears:    Normal TM's and external ear canals, both ears  Nose:   Nares normal, septum midline, mucosa normal, no drainage    or sinus tenderness  Throat:   Lips, mucosa, and tongue normal; teeth and gums normal  Neck:   Supple, symmetrical, trachea midline, no adenopathy;    thyroid:  no enlargement/tenderness/nodules; no carotid   bruit or JVD  Back:     Symmetric, no  curvature, ROM normal, no CVA tenderness  Lungs:     Clear to auscultation bilaterally, respirations unlabored  Chest Wall:    No tenderness or deformity   Heart:    Regular rate and rhythm, S1 and S2 normal, no murmur, rub   or gallop  Breast Exam:    No tenderness, masses, or nipple abnormality  Abdomen:     Soft, non-tender, bowel sounds active all four quadrants,    no masses, no organomegaly  Genitalia:    Normal female without lesion, discharge or tenderness, vaginal discharge c/w BV, open sore the the left of her anus (c/w HSV), no prolapse of cuff, no masses with bimanual exam     Extremities:   Extremities normal, atraumatic, no cyanosis or edema  Pulses:   2+ and symmetric all extremities  Skin:   Skin color, texture, turgor normal, no rashes or lesions  Lymph nodes:   Cervical, supraclavicular, and axillary nodes normal  Neurologic:   CNII-XII intact, normal strength, sensation and reflexes    throughout  .    Assessment:    Healthy female exam.   Anal sore Vaginal discharge   Plan:     Rec colon screening   Check HSV 2 IgG Send wet prep

## 2016-10-06 LAB — CERVICOVAGINAL ANCILLARY ONLY: BACTERIAL VAGINITIS: NEGATIVE

## 2016-10-06 LAB — HSV 2 ANTIBODY, IGG: HSV 2 GLYCOPROTEIN G AB, IGG: 18.6 {index} — AB (ref <0.90)

## 2016-10-09 DIAGNOSIS — Z0100 Encounter for examination of eyes and vision without abnormal findings: Secondary | ICD-10-CM | POA: Diagnosis not present

## 2016-10-09 DIAGNOSIS — H40029 Open angle with borderline findings, high risk, unspecified eye: Secondary | ICD-10-CM | POA: Diagnosis not present

## 2016-10-09 DIAGNOSIS — H40022 Open angle with borderline findings, high risk, left eye: Secondary | ICD-10-CM | POA: Diagnosis not present

## 2016-10-09 DIAGNOSIS — I10 Essential (primary) hypertension: Secondary | ICD-10-CM | POA: Diagnosis not present

## 2016-10-09 DIAGNOSIS — H5212 Myopia, left eye: Secondary | ICD-10-CM | POA: Diagnosis not present

## 2016-10-09 DIAGNOSIS — H52222 Regular astigmatism, left eye: Secondary | ICD-10-CM | POA: Diagnosis not present

## 2016-10-14 ENCOUNTER — Telehealth: Payer: Self-pay | Admitting: General Practice

## 2016-10-14 DIAGNOSIS — A609 Anogenital herpesviral infection, unspecified: Secondary | ICD-10-CM

## 2016-10-14 MED ORDER — VALACYCLOVIR HCL 1 G PO TABS: 2000.0000 mg | ORAL_TABLET | Freq: Two times a day (BID) | ORAL | 6 refills | Status: AC

## 2016-10-14 NOTE — Telephone Encounter (Signed)
Per Dr Hulan Fray, patient has HSV & needs valtrex 2000mg  BID x 7d sent to pharmacy with 6 refills. Called patient & informed her of results & medication sent to pharmacy. Patient verbalized understanding to all & had no questions

## 2016-10-17 ENCOUNTER — Telehealth: Payer: Self-pay | Admitting: *Deleted

## 2016-10-17 NOTE — Telephone Encounter (Signed)
Patient called looking for test results, sees Dr Hulan Fray. She thought that in addition to the test results called to her that an HIV test was also done. Upon check, there was no HIV test drawn. Please call and let her know.

## 2016-10-17 NOTE — Telephone Encounter (Signed)
Returned patient call. Informed patient that no HIV testing was done at her last visit. She stated that she had asked Dr Hulan Fray for HIV testing during her visit. I told her that if she liked she could come to clinic sometime next week for a lab only visit. She was amenable, told her I would ask the front desk to call her to schedule. Understanding voiced.

## 2016-10-23 ENCOUNTER — Other Ambulatory Visit: Payer: Medicare HMO

## 2016-10-31 ENCOUNTER — Ambulatory Visit: Payer: Medicare HMO | Admitting: Obstetrics & Gynecology

## 2016-11-06 ENCOUNTER — Other Ambulatory Visit: Payer: Medicare HMO

## 2016-11-06 DIAGNOSIS — Z113 Encounter for screening for infections with a predominantly sexual mode of transmission: Secondary | ICD-10-CM

## 2016-11-06 DIAGNOSIS — R69 Illness, unspecified: Secondary | ICD-10-CM | POA: Diagnosis not present

## 2016-11-07 LAB — HIV ANTIBODY (ROUTINE TESTING W REFLEX): HIV Screen 4th Generation wRfx: NONREACTIVE

## 2016-11-10 DIAGNOSIS — G4440 Drug-induced headache, not elsewhere classified, not intractable: Secondary | ICD-10-CM | POA: Diagnosis not present

## 2016-11-10 DIAGNOSIS — M25521 Pain in right elbow: Secondary | ICD-10-CM | POA: Diagnosis not present

## 2016-11-10 DIAGNOSIS — M797 Fibromyalgia: Secondary | ICD-10-CM | POA: Diagnosis not present

## 2016-11-10 DIAGNOSIS — M545 Low back pain: Secondary | ICD-10-CM | POA: Diagnosis not present

## 2016-12-09 DIAGNOSIS — G4733 Obstructive sleep apnea (adult) (pediatric): Secondary | ICD-10-CM | POA: Diagnosis not present

## 2016-12-30 DIAGNOSIS — R531 Weakness: Secondary | ICD-10-CM | POA: Diagnosis not present

## 2016-12-30 DIAGNOSIS — G3184 Mild cognitive impairment, so stated: Secondary | ICD-10-CM | POA: Diagnosis not present

## 2016-12-30 DIAGNOSIS — Z87891 Personal history of nicotine dependence: Secondary | ICD-10-CM | POA: Diagnosis not present

## 2016-12-30 DIAGNOSIS — Z6838 Body mass index (BMI) 38.0-38.9, adult: Secondary | ICD-10-CM | POA: Diagnosis not present

## 2016-12-30 DIAGNOSIS — E119 Type 2 diabetes mellitus without complications: Secondary | ICD-10-CM | POA: Diagnosis not present

## 2016-12-30 DIAGNOSIS — Z79899 Other long term (current) drug therapy: Secondary | ICD-10-CM | POA: Diagnosis not present

## 2016-12-30 DIAGNOSIS — Z0000 Encounter for general adult medical examination without abnormal findings: Secondary | ICD-10-CM | POA: Diagnosis not present

## 2016-12-30 DIAGNOSIS — R69 Illness, unspecified: Secondary | ICD-10-CM | POA: Diagnosis not present

## 2016-12-30 DIAGNOSIS — Z7951 Long term (current) use of inhaled steroids: Secondary | ICD-10-CM | POA: Diagnosis not present

## 2016-12-30 DIAGNOSIS — I10 Essential (primary) hypertension: Secondary | ICD-10-CM | POA: Diagnosis not present

## 2016-12-30 DIAGNOSIS — M797 Fibromyalgia: Secondary | ICD-10-CM | POA: Diagnosis not present

## 2016-12-30 DIAGNOSIS — E669 Obesity, unspecified: Secondary | ICD-10-CM | POA: Diagnosis not present

## 2016-12-30 DIAGNOSIS — E785 Hyperlipidemia, unspecified: Secondary | ICD-10-CM | POA: Diagnosis not present

## 2016-12-30 DIAGNOSIS — Z981 Arthrodesis status: Secondary | ICD-10-CM | POA: Diagnosis not present

## 2016-12-30 DIAGNOSIS — D509 Iron deficiency anemia, unspecified: Secondary | ICD-10-CM | POA: Diagnosis not present

## 2016-12-30 DIAGNOSIS — M549 Dorsalgia, unspecified: Secondary | ICD-10-CM | POA: Diagnosis not present

## 2016-12-30 DIAGNOSIS — Z7984 Long term (current) use of oral hypoglycemic drugs: Secondary | ICD-10-CM | POA: Diagnosis not present

## 2016-12-30 DIAGNOSIS — G43009 Migraine without aura, not intractable, without status migrainosus: Secondary | ICD-10-CM | POA: Diagnosis not present

## 2016-12-30 DIAGNOSIS — M542 Cervicalgia: Secondary | ICD-10-CM | POA: Diagnosis not present

## 2016-12-31 DIAGNOSIS — S1093XA Contusion of unspecified part of neck, initial encounter: Secondary | ICD-10-CM | POA: Diagnosis not present

## 2016-12-31 DIAGNOSIS — M542 Cervicalgia: Secondary | ICD-10-CM | POA: Diagnosis not present

## 2016-12-31 DIAGNOSIS — W19XXXA Unspecified fall, initial encounter: Secondary | ICD-10-CM | POA: Diagnosis not present

## 2016-12-31 DIAGNOSIS — R079 Chest pain, unspecified: Secondary | ICD-10-CM | POA: Diagnosis not present

## 2016-12-31 DIAGNOSIS — M19012 Primary osteoarthritis, left shoulder: Secondary | ICD-10-CM | POA: Diagnosis not present

## 2016-12-31 DIAGNOSIS — R0789 Other chest pain: Secondary | ICD-10-CM | POA: Diagnosis not present

## 2016-12-31 DIAGNOSIS — E7800 Pure hypercholesterolemia, unspecified: Secondary | ICD-10-CM | POA: Diagnosis not present

## 2016-12-31 DIAGNOSIS — I498 Other specified cardiac arrhythmias: Secondary | ICD-10-CM | POA: Diagnosis not present

## 2016-12-31 DIAGNOSIS — W109XXA Fall (on) (from) unspecified stairs and steps, initial encounter: Secondary | ICD-10-CM | POA: Diagnosis not present

## 2016-12-31 DIAGNOSIS — I499 Cardiac arrhythmia, unspecified: Secondary | ICD-10-CM | POA: Diagnosis not present

## 2016-12-31 DIAGNOSIS — S40012A Contusion of left shoulder, initial encounter: Secondary | ICD-10-CM | POA: Diagnosis not present

## 2016-12-31 DIAGNOSIS — I10 Essential (primary) hypertension: Secondary | ICD-10-CM | POA: Diagnosis not present

## 2016-12-31 DIAGNOSIS — E119 Type 2 diabetes mellitus without complications: Secondary | ICD-10-CM | POA: Diagnosis not present

## 2017-01-07 DIAGNOSIS — G43809 Other migraine, not intractable, without status migrainosus: Secondary | ICD-10-CM | POA: Diagnosis not present

## 2017-01-07 DIAGNOSIS — E119 Type 2 diabetes mellitus without complications: Secondary | ICD-10-CM | POA: Diagnosis not present

## 2017-01-07 DIAGNOSIS — M791 Myalgia: Secondary | ICD-10-CM | POA: Diagnosis not present

## 2017-01-07 DIAGNOSIS — R0789 Other chest pain: Secondary | ICD-10-CM | POA: Diagnosis not present

## 2017-01-07 DIAGNOSIS — G4733 Obstructive sleep apnea (adult) (pediatric): Secondary | ICD-10-CM | POA: Diagnosis not present

## 2017-01-07 DIAGNOSIS — R002 Palpitations: Secondary | ICD-10-CM | POA: Diagnosis not present

## 2017-01-07 DIAGNOSIS — I10 Essential (primary) hypertension: Secondary | ICD-10-CM | POA: Diagnosis not present

## 2017-01-07 DIAGNOSIS — E785 Hyperlipidemia, unspecified: Secondary | ICD-10-CM | POA: Diagnosis not present

## 2017-01-13 DIAGNOSIS — I10 Essential (primary) hypertension: Secondary | ICD-10-CM | POA: Diagnosis not present

## 2017-01-13 DIAGNOSIS — E785 Hyperlipidemia, unspecified: Secondary | ICD-10-CM | POA: Diagnosis not present

## 2017-01-13 DIAGNOSIS — E119 Type 2 diabetes mellitus without complications: Secondary | ICD-10-CM | POA: Diagnosis not present

## 2017-01-14 DIAGNOSIS — R69 Illness, unspecified: Secondary | ICD-10-CM | POA: Diagnosis not present

## 2017-01-16 DIAGNOSIS — R69 Illness, unspecified: Secondary | ICD-10-CM | POA: Diagnosis not present

## 2017-01-19 ENCOUNTER — Other Ambulatory Visit (HOSPITAL_COMMUNITY): Payer: Self-pay | Admitting: Obstetrics & Gynecology

## 2017-01-19 DIAGNOSIS — Z1231 Encounter for screening mammogram for malignant neoplasm of breast: Secondary | ICD-10-CM

## 2017-01-22 DIAGNOSIS — R093 Abnormal sputum: Secondary | ICD-10-CM | POA: Diagnosis not present

## 2017-01-22 DIAGNOSIS — R05 Cough: Secondary | ICD-10-CM | POA: Diagnosis not present

## 2017-01-22 DIAGNOSIS — J069 Acute upper respiratory infection, unspecified: Secondary | ICD-10-CM | POA: Diagnosis not present

## 2017-01-22 DIAGNOSIS — J3489 Other specified disorders of nose and nasal sinuses: Secondary | ICD-10-CM | POA: Diagnosis not present

## 2017-01-22 DIAGNOSIS — R509 Fever, unspecified: Secondary | ICD-10-CM | POA: Diagnosis not present

## 2017-01-22 DIAGNOSIS — R0981 Nasal congestion: Secondary | ICD-10-CM | POA: Diagnosis not present

## 2017-02-06 ENCOUNTER — Other Ambulatory Visit: Payer: Self-pay | Admitting: Cardiology

## 2017-02-06 DIAGNOSIS — I10 Essential (primary) hypertension: Secondary | ICD-10-CM | POA: Diagnosis not present

## 2017-02-06 DIAGNOSIS — R079 Chest pain, unspecified: Secondary | ICD-10-CM

## 2017-02-06 DIAGNOSIS — E669 Obesity, unspecified: Secondary | ICD-10-CM | POA: Diagnosis not present

## 2017-02-06 DIAGNOSIS — E785 Hyperlipidemia, unspecified: Secondary | ICD-10-CM | POA: Diagnosis not present

## 2017-02-06 DIAGNOSIS — E119 Type 2 diabetes mellitus without complications: Secondary | ICD-10-CM | POA: Diagnosis not present

## 2017-02-06 DIAGNOSIS — G4733 Obstructive sleep apnea (adult) (pediatric): Secondary | ICD-10-CM | POA: Diagnosis not present

## 2017-02-06 DIAGNOSIS — M797 Fibromyalgia: Secondary | ICD-10-CM | POA: Diagnosis not present

## 2017-02-06 DIAGNOSIS — G43809 Other migraine, not intractable, without status migrainosus: Secondary | ICD-10-CM | POA: Diagnosis not present

## 2017-02-06 DIAGNOSIS — R69 Illness, unspecified: Secondary | ICD-10-CM | POA: Diagnosis not present

## 2017-02-17 ENCOUNTER — Ambulatory Visit
Admission: RE | Admit: 2017-02-17 | Discharge: 2017-02-17 | Disposition: A | Discharge status: Home / Self Care | Payer: Medicare HMO | Source: Ambulatory Visit | Attending: Obstetrics & Gynecology | Admitting: Obstetrics & Gynecology

## 2017-02-17 DIAGNOSIS — Z1231 Encounter for screening mammogram for malignant neoplasm of breast: Secondary | ICD-10-CM | POA: Diagnosis not present

## 2017-02-20 ENCOUNTER — Ambulatory Visit (HOSPITAL_COMMUNITY)
Admission: RE | Admit: 2017-02-20 | Discharge: 2017-02-20 | Disposition: A | Discharge status: Home / Self Care | Payer: Medicare HMO | Source: Ambulatory Visit | Attending: Cardiology | Admitting: Cardiology

## 2017-02-20 DIAGNOSIS — R002 Palpitations: Secondary | ICD-10-CM | POA: Diagnosis not present

## 2017-02-20 DIAGNOSIS — I10 Essential (primary) hypertension: Secondary | ICD-10-CM | POA: Diagnosis not present

## 2017-02-20 DIAGNOSIS — E785 Hyperlipidemia, unspecified: Secondary | ICD-10-CM | POA: Diagnosis not present

## 2017-02-20 DIAGNOSIS — R079 Chest pain, unspecified: Secondary | ICD-10-CM | POA: Diagnosis not present

## 2017-02-20 DIAGNOSIS — E119 Type 2 diabetes mellitus without complications: Secondary | ICD-10-CM | POA: Diagnosis not present

## 2017-02-20 DIAGNOSIS — R9431 Abnormal electrocardiogram [ECG] [EKG]: Secondary | ICD-10-CM | POA: Diagnosis not present

## 2017-02-20 MED ORDER — SODIUM CHLORIDE 0.9 % IV SOLN
Freq: Once | INTRAVENOUS | Status: AC
  Administered 2017-02-20: 12:00:00 via INTRAVENOUS

## 2017-02-20 MED ORDER — REGADENOSON 0.4 MG/5ML IV SOLN
INTRAVENOUS | Status: AC
  Administered 2017-02-20: 0.4 mg via INTRAVENOUS
  Filled 2017-02-20: qty 5

## 2017-02-20 MED ORDER — TECHNETIUM TC 99M TETROFOSMIN IV KIT
10.0000 | PACK | Freq: Once | INTRAVENOUS | Status: AC | PRN
  Administered 2017-02-20: 10 via INTRAVENOUS

## 2017-02-20 MED ORDER — TECHNETIUM TC 99M TETROFOSMIN IV KIT: 30.0000 | PACK | Freq: Once | INTRAVENOUS | Status: DC | PRN

## 2017-02-20 MED ORDER — REGADENOSON 0.4 MG/5ML IV SOLN
0.4000 mg | Freq: Once | INTRAVENOUS | Status: AC
  Administered 2017-02-20: 0.4 mg via INTRAVENOUS

## 2017-02-20 NOTE — Progress Notes (Signed)
Pts BP dropped into 80's during stress test-( Lexi) Dr Terrence Dupont ordered 250cc IV NS Bolus to be given. Will continue to monitor pt

## 2017-03-01 ENCOUNTER — Emergency Department (HOSPITAL_COMMUNITY): Admission: EM | Admit: 2017-03-01 | Discharge: 2017-03-01 | Discharge status: Absent without leave | Payer: Medicare HMO

## 2017-03-12 DIAGNOSIS — G4733 Obstructive sleep apnea (adult) (pediatric): Secondary | ICD-10-CM | POA: Diagnosis not present

## 2017-03-23 DIAGNOSIS — R69 Illness, unspecified: Secondary | ICD-10-CM | POA: Diagnosis not present

## 2017-04-16 DIAGNOSIS — E785 Hyperlipidemia, unspecified: Secondary | ICD-10-CM | POA: Diagnosis not present

## 2017-04-16 DIAGNOSIS — M797 Fibromyalgia: Secondary | ICD-10-CM | POA: Diagnosis not present

## 2017-04-16 DIAGNOSIS — R69 Illness, unspecified: Secondary | ICD-10-CM | POA: Diagnosis not present

## 2017-04-16 DIAGNOSIS — G43109 Migraine with aura, not intractable, without status migrainosus: Secondary | ICD-10-CM | POA: Diagnosis not present

## 2017-04-16 DIAGNOSIS — G894 Chronic pain syndrome: Secondary | ICD-10-CM | POA: Diagnosis not present

## 2017-04-16 DIAGNOSIS — I10 Essential (primary) hypertension: Secondary | ICD-10-CM | POA: Diagnosis not present

## 2017-04-16 DIAGNOSIS — G4733 Obstructive sleep apnea (adult) (pediatric): Secondary | ICD-10-CM | POA: Diagnosis not present

## 2017-04-16 DIAGNOSIS — D649 Anemia, unspecified: Secondary | ICD-10-CM | POA: Diagnosis not present

## 2017-04-16 DIAGNOSIS — M79601 Pain in right arm: Secondary | ICD-10-CM | POA: Diagnosis not present

## 2017-04-16 DIAGNOSIS — E1159 Type 2 diabetes mellitus with other circulatory complications: Secondary | ICD-10-CM | POA: Diagnosis not present

## 2017-04-19 DIAGNOSIS — R69 Illness, unspecified: Secondary | ICD-10-CM | POA: Diagnosis not present

## 2017-04-23 DIAGNOSIS — Z79899 Other long term (current) drug therapy: Secondary | ICD-10-CM | POA: Diagnosis not present

## 2017-04-23 DIAGNOSIS — M797 Fibromyalgia: Secondary | ICD-10-CM | POA: Diagnosis not present

## 2017-04-23 DIAGNOSIS — Z5181 Encounter for therapeutic drug level monitoring: Secondary | ICD-10-CM | POA: Diagnosis not present

## 2017-04-23 DIAGNOSIS — G4440 Drug-induced headache, not elsewhere classified, not intractable: Secondary | ICD-10-CM | POA: Diagnosis not present

## 2017-04-23 DIAGNOSIS — M545 Low back pain: Secondary | ICD-10-CM | POA: Diagnosis not present

## 2017-04-23 DIAGNOSIS — G43709 Chronic migraine without aura, not intractable, without status migrainosus: Secondary | ICD-10-CM | POA: Diagnosis not present

## 2017-05-07 DIAGNOSIS — E119 Type 2 diabetes mellitus without complications: Secondary | ICD-10-CM | POA: Diagnosis not present

## 2017-05-07 DIAGNOSIS — I208 Other forms of angina pectoris: Secondary | ICD-10-CM | POA: Diagnosis not present

## 2017-05-07 DIAGNOSIS — R69 Illness, unspecified: Secondary | ICD-10-CM | POA: Diagnosis not present

## 2017-05-07 DIAGNOSIS — G4733 Obstructive sleep apnea (adult) (pediatric): Secondary | ICD-10-CM | POA: Diagnosis not present

## 2017-05-07 DIAGNOSIS — G43909 Migraine, unspecified, not intractable, without status migrainosus: Secondary | ICD-10-CM | POA: Diagnosis not present

## 2017-05-07 DIAGNOSIS — M797 Fibromyalgia: Secondary | ICD-10-CM | POA: Diagnosis not present

## 2017-05-07 DIAGNOSIS — E669 Obesity, unspecified: Secondary | ICD-10-CM | POA: Diagnosis not present

## 2017-05-07 DIAGNOSIS — E785 Hyperlipidemia, unspecified: Secondary | ICD-10-CM | POA: Diagnosis not present

## 2017-05-07 DIAGNOSIS — I10 Essential (primary) hypertension: Secondary | ICD-10-CM | POA: Diagnosis not present

## 2017-05-07 DIAGNOSIS — R9439 Abnormal result of other cardiovascular function study: Secondary | ICD-10-CM | POA: Diagnosis not present

## 2017-06-11 ENCOUNTER — Observation Stay (HOSPITAL_COMMUNITY)
Admission: RE | Admit: 2017-06-11 | Discharge: 2017-06-12 | Disposition: A | Discharge status: Home / Self Care | Payer: Medicare Other | Source: Ambulatory Visit | Attending: Cardiology | Admitting: Cardiology

## 2017-06-11 ENCOUNTER — Encounter (HOSPITAL_COMMUNITY)
Admission: RE | Disposition: A | Discharge status: Home / Self Care | Payer: Self-pay | Source: Ambulatory Visit | Attending: Cardiology

## 2017-06-11 DIAGNOSIS — G4733 Obstructive sleep apnea (adult) (pediatric): Secondary | ICD-10-CM | POA: Insufficient documentation

## 2017-06-11 DIAGNOSIS — M797 Fibromyalgia: Secondary | ICD-10-CM | POA: Insufficient documentation

## 2017-06-11 DIAGNOSIS — I209 Angina pectoris, unspecified: Secondary | ICD-10-CM | POA: Diagnosis present

## 2017-06-11 DIAGNOSIS — E785 Hyperlipidemia, unspecified: Secondary | ICD-10-CM | POA: Insufficient documentation

## 2017-06-11 DIAGNOSIS — I10 Essential (primary) hypertension: Secondary | ICD-10-CM | POA: Diagnosis not present

## 2017-06-11 DIAGNOSIS — F419 Anxiety disorder, unspecified: Secondary | ICD-10-CM | POA: Diagnosis not present

## 2017-06-11 DIAGNOSIS — F329 Major depressive disorder, single episode, unspecified: Secondary | ICD-10-CM | POA: Insufficient documentation

## 2017-06-11 DIAGNOSIS — Z7982 Long term (current) use of aspirin: Secondary | ICD-10-CM | POA: Diagnosis not present

## 2017-06-11 DIAGNOSIS — I25110 Atherosclerotic heart disease of native coronary artery with unstable angina pectoris: Principal | ICD-10-CM | POA: Insufficient documentation

## 2017-06-11 DIAGNOSIS — Z79899 Other long term (current) drug therapy: Secondary | ICD-10-CM | POA: Insufficient documentation

## 2017-06-11 HISTORY — PX: LEFT HEART CATH AND CORONARY ANGIOGRAPHY: CATH118249

## 2017-06-11 LAB — GLUCOSE, CAPILLARY
GLUCOSE-CAPILLARY: 99 mg/dL (ref 65–99)
Glucose-Capillary: 101 mg/dL — ABNORMAL HIGH (ref 65–99)
Glucose-Capillary: 127 mg/dL — ABNORMAL HIGH (ref 65–99)
Glucose-Capillary: 97 mg/dL (ref 65–99)

## 2017-06-11 SURGERY — LEFT HEART CATH AND CORONARY ANGIOGRAPHY
Anesthesia: LOCAL

## 2017-06-11 MED ORDER — HEPARIN (PORCINE) IN NACL 2-0.9 UNIT/ML-% IJ SOLN
INTRAMUSCULAR | Status: AC
  Filled 2017-06-11: qty 500

## 2017-06-11 MED ORDER — IOPAMIDOL (ISOVUE-370) INJECTION 76%
INTRAVENOUS | Status: DC | PRN
  Administered 2017-06-11: 50 mL via INTRA_ARTERIAL

## 2017-06-11 MED ORDER — SODIUM CHLORIDE 0.9% FLUSH: 3.0000 mL | INTRAVENOUS | Status: DC | PRN

## 2017-06-11 MED ORDER — CLOPIDOGREL BISULFATE 75 MG PO TABS: 75.0000 mg | ORAL_TABLET | ORAL | Status: DC

## 2017-06-11 MED ORDER — ASPIRIN 81 MG PO CHEW: 81.0000 mg | CHEWABLE_TABLET | ORAL | Status: DC

## 2017-06-11 MED ORDER — ASPIRIN EC 81 MG PO TBEC
81.0000 mg | DELAYED_RELEASE_TABLET | Freq: Every day | ORAL | Status: DC
  Administered 2017-06-12: 81 mg via ORAL
  Filled 2017-06-11: qty 1

## 2017-06-11 MED ORDER — ACETAMINOPHEN 325 MG PO TABS
650.0000 mg | ORAL_TABLET | ORAL | Status: DC | PRN
  Administered 2017-06-11 (×2): 650 mg via ORAL
  Filled 2017-06-11: qty 2

## 2017-06-11 MED ORDER — SODIUM CHLORIDE 0.9 % IV SOLN: 250.0000 mL | INTRAVENOUS | Status: DC | PRN

## 2017-06-11 MED ORDER — SODIUM CHLORIDE 0.9 % WEIGHT BASED INFUSION: 1.0000 mL/kg/h | INTRAVENOUS | Status: DC

## 2017-06-11 MED ORDER — MIDAZOLAM HCL 2 MG/2ML IJ SOLN
INTRAMUSCULAR | Status: AC
  Filled 2017-06-11: qty 2

## 2017-06-11 MED ORDER — FENTANYL CITRATE (PF) 100 MCG/2ML IJ SOLN
INTRAMUSCULAR | Status: AC
  Filled 2017-06-11: qty 2

## 2017-06-11 MED ORDER — METOPROLOL SUCCINATE ER 50 MG PO TB24
50.0000 mg | ORAL_TABLET | Freq: Every day | ORAL | Status: DC
  Administered 2017-06-11 – 2017-06-12 (×2): 50 mg via ORAL
  Filled 2017-06-11 (×2): qty 1

## 2017-06-11 MED ORDER — LIDOCAINE HCL 2 % IJ SOLN
INTRAMUSCULAR | Status: AC
  Filled 2017-06-11: qty 10

## 2017-06-11 MED ORDER — IOPAMIDOL (ISOVUE-370) INJECTION 76%
INTRAVENOUS | Status: AC
  Filled 2017-06-11: qty 100

## 2017-06-11 MED ORDER — HEPARIN (PORCINE) IN NACL 2-0.9 UNIT/ML-% IJ SOLN
INTRAMUSCULAR | Status: AC | PRN
  Administered 2017-06-11: 1000 mL

## 2017-06-11 MED ORDER — MIDAZOLAM HCL 2 MG/2ML IJ SOLN
INTRAMUSCULAR | Status: DC | PRN
  Administered 2017-06-11 (×2): 1 mg via INTRAVENOUS

## 2017-06-11 MED ORDER — INSULIN ASPART 100 UNIT/ML ~~LOC~~ SOLN
0.0000 [IU] | Freq: Three times a day (TID) | SUBCUTANEOUS | Status: DC
Start: 2017-06-11 — End: 2017-06-12

## 2017-06-11 MED ORDER — FENTANYL CITRATE (PF) 100 MCG/2ML IJ SOLN
INTRAMUSCULAR | Status: DC | PRN
  Administered 2017-06-11 (×2): 25 ug via INTRAVENOUS

## 2017-06-11 MED ORDER — SODIUM CHLORIDE 0.9 % IV SOLN: INTRAVENOUS | Status: AC

## 2017-06-11 MED ORDER — SODIUM CHLORIDE 0.9% FLUSH
3.0000 mL | Freq: Two times a day (BID) | INTRAVENOUS | Status: DC
  Administered 2017-06-11 – 2017-06-12 (×2): 3 mL via INTRAVENOUS

## 2017-06-11 MED ORDER — SODIUM CHLORIDE 0.9 % WEIGHT BASED INFUSION
3.0000 mL/kg/h | INTRAVENOUS | Status: DC
  Administered 2017-06-11: 3 mL/kg/h via INTRAVENOUS

## 2017-06-11 MED ORDER — ACETAMINOPHEN 325 MG PO TABS
ORAL_TABLET | ORAL | Status: AC
  Filled 2017-06-11: qty 2

## 2017-06-11 MED ORDER — TOPIRAMATE 100 MG PO TABS
100.0000 mg | ORAL_TABLET | Freq: Two times a day (BID) | ORAL | Status: DC
  Administered 2017-06-11 – 2017-06-12 (×2): 100 mg via ORAL
  Filled 2017-06-11 (×2): qty 1

## 2017-06-11 MED ORDER — SODIUM CHLORIDE 0.9 % IV SOLN
INTRAVENOUS | Status: AC | PRN
  Administered 2017-06-11: 250 mL via INTRAVENOUS

## 2017-06-11 MED ORDER — ONDANSETRON HCL 4 MG/2ML IJ SOLN: 4.0000 mg | Freq: Four times a day (QID) | INTRAMUSCULAR | Status: DC | PRN

## 2017-06-11 MED ORDER — SODIUM CHLORIDE 0.9% FLUSH: 3.0000 mL | Freq: Two times a day (BID) | INTRAVENOUS | Status: DC

## 2017-06-11 SURGICAL SUPPLY — 8 items
CATH INFINITI 5FR MULTPACK ANG (CATHETERS) ×2 IMPLANT
KIT HEART LEFT (KITS) ×2 IMPLANT
PACK CARDIAC CATHETERIZATION (CUSTOM PROCEDURE TRAY) ×2 IMPLANT
SHEATH PINNACLE 5F 10CM (SHEATH) ×2 IMPLANT
SYR MEDRAD MARK V 150ML (SYRINGE) ×2 IMPLANT
TRANSDUCER W/STOPCOCK (MISCELLANEOUS) ×2 IMPLANT
TUBING CIL FLEX 10 FLL-RA (TUBING) ×2 IMPLANT
TUBING CONTRAST HIGH PRESS 20 (MISCELLANEOUS) ×2 IMPLANT

## 2017-06-11 NOTE — Progress Notes (Addendum)
Site area: Right groin a 5 french arterial sheath was removed  Site Prior to Removal:  Level 0  Pressure Applied For 15 MINUTES    Bedrest Beginning at 0835am  Manual:   Yes.    Patient Status During Pull:  stable  Post Pull Groin Site:  Level 0  Post Pull Instructions Given:  Yes.    Post Pull Pulses Present:  Yes.    Dressing Applied:  Yes.    Comments:  VS remain stable

## 2017-06-11 NOTE — Interval H&P Note (Signed)
Cath Lab Visit (complete for each Cath Lab visit)  Clinical Evaluation Leading to the Procedure:   ACS: No.  Non-ACS:    Anginal Classification: CCS III  Anti-ischemic medical therapy: Maximal Therapy (2 or more classes of medications)  Non-Invasive Test Results: Low-risk stress test findings: cardiac mortality <1%/year  Prior CABG: No previous CABG      History and Physical Interval Note:  06/11/2017 7:31 AM  Margaret [name]  has presented today for surgery, with the diagnosis of chest pain  The various methods of treatment have been discussed with the patient and family. After consideration of risks, benefits and other options for treatment, the patient has consented to  Procedure(s): LEFT HEART CATH AND CORONARY ANGIOGRAPHY (N/A) as a surgical intervention .  The patient's history has been reviewed, patient examined, no change in status, stable for surgery.  I have reviewed the patient's chart and labs.  Questions were answered to the patient's satisfaction.     Charolette Forward

## 2017-06-11 NOTE — H&P (Signed)
Printed H&P in the chart needs to be scanned 

## 2017-06-11 NOTE — Progress Notes (Signed)
Margaret [name] [id number]  Code Status: FULL  Admission Data: 06/11/2017 6:24 PM  Attending Provider: Terrence Dupont  [id number], Margaret Spies, MD  Consults/ Treatment Team:   Margaret [name] is a 56 y.o. female patient admitted from Cath lab awake, alert - oriented X 4 - no acute distress noted. VSS - Blood pressure 113/76, pulse 60, temperature 98.3 F (36.8 C), temperature source Oral, resp. rate 15, height 5\' 4"  (1.626 m), weight 105.7 kg (233 lb), last menstrual period 03/30/2011, SpO2 100 %. no c/o shortness of breath, no c/o chest pain.   IV Fluids: IV in place, occlusive dsg intact without redness.   Allergies:  Allergies  Allergen Reactions  . Adhesive [Tape] Rash, Itching and Swelling    Use paper tape Use paper tape  . Cortisone Anxiety, Other (See Comments), Hives, Itching, Nausea And Vomiting, Palpitations, Rash and Shortness Of Breath  . Naproxen Hives  . Penicillins Hives    Has patient had a PCN reaction causing immediate rash, facial/tongue/throat swelling, SOB or lightheadedness with hypotension: yes   Has patient had a PCN reaction causing severe rash involving mucus membranes or skin necrosis: no Has patient had a PCN reaction that required hospitalization: unknown Has patient had a PCN reaction occurring within the last 10 years: no If all of the above answers are "NO", then may proceed with Cephalosporin use.Doctor says to try cefazolin       Past Medical History:  Diagnosis Date  . Allergy   . Anemia   . Anxiety   . Chronic back pain   . Depression   . Diabetes mellitus without complication ([place])   . Fibroids   . Hyperlipidemia   . Hypertension   . Menorrhagia   . Migraines    not on medication  . Morbid obesity ([place])   . Sleep apnea     Medications Prior to Admission  Medication Sig Dispense Refill  . amLODipine (NORVASC) 2.5 MG tablet Take 2.5 mg by mouth daily.    Marland Kitchen aspirin EC 81 MG tablet Take 81 mg by mouth daily.    . baclofen (LIORESAL) 10 MG tablet  Take 5 mg by mouth 2 (two) times daily as needed.     . clonazePAM (KLONOPIN) 0.5 MG tablet Take 0.5 mg by mouth 2 (two) times daily as needed.  0  . DULoxetine (CYMBALTA) 60 MG capsule Take 60 mg by mouth daily.      . ferrous sulfate 325 (65 FE) MG tablet Take 325 mg by mouth daily with breakfast.    . folic acid (FOLVITE) 809 MCG tablet Take 800 mcg by mouth daily.     Marland Kitchen losartan (COZAAR) 50 MG tablet Take 25 mg by mouth daily.  3  . metoprolol succinate (TOPROL-XL) 50 MG 24 hr tablet Take 50 mg by mouth daily.  2  . nitroGLYCERIN (NITROSTAT) 0.4 MG SL tablet Place 0.4 mg under the tongue every 5 (five) minutes as needed for chest pain.    . simvastatin (ZOCOR) 40 MG tablet Take 40 mg by mouth at bedtime.      . topiramate (TOPAMAX) 100 MG tablet Take 100 mg by mouth 2 (two) times daily.  2  . TRADJENTA 5 MG TABS tablet Take 5 mg by mouth daily.  3  . traMADol (ULTRAM) 50 MG tablet Take 50 mg by mouth every 6 (six) hours as needed for moderate pain.     . vitamin B-12 (CYANOCOBALAMIN) 500 MCG tablet Take 500 mcg by mouth daily.    . [  DISCONTINUED] clopidogrel (PLAVIX) 75 MG tablet Take 75 mg by mouth daily.      Orientation to room, and floor completed with information packet given to patient/family. Patient declined safety video at this time. Admission INP armband ID verified with patient/family, and in place.  SR up x 2, fall assessment complete, with patient and family able to verbalize understanding of risk associated with falls, and verbalized understanding to call nsg before up out of bed. Call light within reach, patient able to voice, and demonstrate understanding. Skin, clean-dry- intact without evidence of bruising, or skin tears.  No evidence of skin break down noted on exam.  ?  Will cont to eval and treat per MD orders.  Margaret Florida, RN  06/11/2017 6:24 PM

## 2017-06-11 NOTE — Progress Notes (Signed)
Patient refused 1700 insulin. States she does not take insulin at home and that she takes [place]. MD notified, said to hold insulin since CBG was only 127.

## 2017-06-12 ENCOUNTER — Encounter (HOSPITAL_COMMUNITY): Payer: Self-pay | Admitting: Cardiology

## 2017-06-12 DIAGNOSIS — I25110 Atherosclerotic heart disease of native coronary artery with unstable angina pectoris: Secondary | ICD-10-CM | POA: Diagnosis not present

## 2017-06-12 LAB — GLUCOSE, CAPILLARY
GLUCOSE-CAPILLARY: 108 mg/dL — AB (ref 65–99)
GLUCOSE-CAPILLARY: 104 mg/dL — AB (ref 65–99)

## 2017-06-12 MED FILL — Lidocaine HCl Local Inj 2%: INTRAMUSCULAR | Qty: 10 | Status: AC

## 2017-06-12 NOTE — Discharge Summary (Signed)
Discharge summary dictated on 06/12/2017, dictation number is [phone]

## 2017-06-12 NOTE — Discharge Summary (Signed)
Margaret [name], Margaret [name]                  ACCOUNT NO.:  [id number]  MEDICAL RECORD NO.:  [id number]  LOCATION:  [facility]                         FACILITY:  [facility]  PHYSICIAN:  Javeah Loeza N. Terrence Dupont, M.D. DATE OF BIRTH:  05-31-1961  DATE OF ADMISSION:  06/11/2017 DATE OF DISCHARGE:  06/12/2017                              DISCHARGE SUMMARY   ADMITTING DIAGNOSES: 1. New onset angina, abnormal nuclear stress test, rule out coronary     insufficiency. 2. Hypertension. 3. Hyperlipidemia. 4. Diabetes mellitus. 5. Anxiety disorder. 6. Depression. 7. Migraine headache. 8. Morbid obesity. 9. Obstructive sleep apnea. 10.Fibromyalgia.  DISCHARGE DIAGNOSES: 1. Stable angina, status post left cardiac catheterization. 2. Hypertension. 3. Hyperlipidemia. 4. Diabetes mellitus. 5. Depression. 6. Anxiety disorder. 7. Migraine headache. 8. Morbid obesity. 9. Obstructive sleep apnea. 10.Fibromyalgia.  DISCHARGE HOME MEDICATIONS: 1. Aspirin 81 mg 1 tablet daily. 2. Baclofen 10 mg twice daily as needed. 3. Clonazepam 0.5 mg twice daily. 4. Cymbalta 60 mg daily. 5. Ferrous sulfate 325 mg daily. 6. Folic acid 400 mcg daily. 7. Losartan 50 mg half tablet daily. 8. Metoprolol succinate 50 mg daily. 9. Nitrostat 0.4 mg sublingual p.r.n. 10.Simvastatin 40 mg daily. 11.Topamax 100 mg daily. 12.Tradjenta 5 mg daily. 13.Tramadol 50 mg every 6 hours as needed. 14.Vitamin B12 500 mcg daily as before.  DIET:  Low-salt, low-cholesterol, 1800 calories ADA, weight-reducing diet.  Post-cardiac cath instructions have been given.  FOLLOWUP:  Follow up with me in 1 week.  CONDITION AT DISCHARGE:  Stable.  BRIEF HISTORY AND HOSPITAL COURSE:  Margaret [name] is a 56 year old female with past medical history significant for hypertension, hyperlipidemia, type 2 diabetes mellitus, morbid obesity, obstructive sleep apnea, history of migraine headache, depression, fibromyalgia, came to the office complaining of recurrent  retrosternal chest pain described as pressure, grade 5 to 6/10 associated with nausea, lasting 10-15 minutes, relieved with rest.  Also complains of exertional dyspnea with minimal exertion.  The patient denies any palpitation, lightheadedness, or syncope.  Denies PND, orthopnea, leg swelling.  The patient underwent Lexiscan Myoview in June 2015 that showed mild to moderate-sized reversible ischemia with mild anteroseptal wall in the mid anteroseptal wall with EF of 55%.  The patient was initially treated medically, but due to recurrent chest pain which is occurring 3-4 times per week and lasting longer time, the patient decided to proceed with invasive therapy.  PHYSICAL EXAMINATION:  GENERAL:  She was alert, awake, oriented x3, in no acute distress. VITAL SIGNS:  Blood pressure 119/79, pulse 71 and regular. HEENT:  Conjunctivae were pink. NECK:  Supple.  No JVD.  No bruit. LUNGS:  Clear to auscultation without rhonchi or rales. CARDIOVASCULAR:  S1, S2 were normal.  There was 1/6 systolic murmur. ABDOMEN:  Soft.  Bowel sounds present.  Nontender. EXTREMITIES:  No clubbing, cyanosis, or edema.  LABORATORY DATA:  Her hemoglobin was 11.6, hematocrit 36.7, white count of 7.7.  PT/INR was in normal range.  Glucose was 99, BUN 15, creatinine 0.98.  BRIEF HOSPITAL COURSE:  The patient was a.m. admit and underwent left cardiac catheterization as per procedure report.  The patient tolerated the procedure well and there were no complications.  Postprocedure,  the patient did not have any episodes of chest pain during the hospital stay.  Her groin is stable.  She is ambulating in room without any problems.  The patient will be discharged home on above medications and will be followed up in my office in 1 week.     Allegra Lai. Terrence Dupont, M.D.     MNH/MEDQ  D:  06/12/2017  T:  06/12/2017  Job:  [id number]

## 2017-06-12 NOTE — Discharge Instructions (Signed)

## 2017-06-12 NOTE — Care Management Obs Status (Signed)
MEDICARE OBSERVATION STATUS NOTIFICATION   Patient Details  Name: Margaret [name] MRN: [id number] Date of Birth: [birth year]   Medicare Observation Status Notification Given:  Yes    Sharin Mons, RN 06/12/2017, 12:09 PM

## 2017-06-19 ENCOUNTER — Encounter: Payer: Self-pay | Admitting: Neurology

## 2017-07-01 ENCOUNTER — Ambulatory Visit: Payer: Medicare Other | Admitting: Registered"

## 2017-09-21 ENCOUNTER — Ambulatory Visit (INDEPENDENT_AMBULATORY_CARE_PROVIDER_SITE_OTHER): Payer: Medicare Other | Admitting: Neurology

## 2017-09-21 ENCOUNTER — Encounter: Payer: Self-pay | Admitting: Neurology

## 2017-09-21 ENCOUNTER — Other Ambulatory Visit (INDEPENDENT_AMBULATORY_CARE_PROVIDER_SITE_OTHER): Payer: Medicare Other

## 2017-09-21 VITALS — BP 104/74 | HR 85 | Ht 64.0 in | Wt 225.0 lb

## 2017-09-21 DIAGNOSIS — M797 Fibromyalgia: Secondary | ICD-10-CM | POA: Diagnosis not present

## 2017-09-21 DIAGNOSIS — R202 Paresthesia of skin: Secondary | ICD-10-CM | POA: Diagnosis not present

## 2017-09-21 LAB — TSH: TSH: 1.08 u[IU]/mL (ref 0.35–4.50)

## 2017-09-21 LAB — FOLATE: Folate: 15 ng/mL (ref >5.9)

## 2017-09-21 LAB — VITAMIN B12

## 2017-09-21 NOTE — Progress Notes (Signed)
[facility] Clinic Note - Initial Visit   Date: 09/21/17  Margaret [name] MRN: [id number] DOB: 1961-08-04   Dear Dr. Fara Olden:  Thank you for your kind referral of Margaret [name] for consultation of peripheral neuropathy. Although her history is well known to you, please allow [place] to reiterate it for the purpose of our medical record. The patient was accompanied to the clinic by self.    History of Present Illness: Margaret [name] is a 57 y.o. right-handed African American female with hyperlipidemia, hypertension, CAD, diabetes mellitus (HbA1c 6.4), fibromyalgia, migraines, OSA on CPAP, depression, and s/p cervical surgery x 2 by Dr. Joya Salm presenting for evaluation of tingling of the arms and legs.    Starting around summer of 2018, she began having intermittent spells of generalized numbness, tingling, and stabbing of the arms, forearm, and hands and both feet.  Over the past few months, it has become more noticeable and now she has constant tingling of the arms.  She has not identified any triggers, such as activity.  She has tried ice, rest, and tylenol which does not help.   Her feet paresthesias are less intense and occur about 3-4 times per day, lasting a few minutes.  She has not noticed any identifiable triggers or alleviating factors.   She denies any weakness or imbalance.  She walks unassisted and has not had any recent falls.  She sees [facility] for fibromyalgia.  She also sees Dr. Olevia Bowens at [facility] for migraines and takes topiramate 100mg /d.  Out-side paper records, electronic medical record, and images have been reviewed where available and summarized as:  Lab Results  Component Value Date   TSH 0.986 06/09/2012   Lab Results  Component Value Date   CREATININE 0.80 07/12/2012   BUN 12 07/12/2012   NA 135 07/12/2012   K 3.3 (L) 07/12/2012   CL 100 07/12/2012   CO2 26 07/12/2012   MRI lumbar spine 08/13/2009: 1.  Compared  with the prior study of 11/24/2008, no acute findings are identified. 2.  Stable disc bulging and facet disease at L4-L5 and L5-S1.  No focal disc protrusion, spinal stenosis or nerve root encroachment. 3.  Uterine fibroids.  MRI cervical spine wo contrast 01/30/2006: 1.  Dominant finding is at C4-5 where there is a left paracentral protrusion superimposed on disk bulge contacting and mildly indenting the left hemicord.  Based on prior report, this is a new finding. 2.  Mild disk bulge at C5-6 without central canal or foraminal stenosis. 3.  Reversal of the normal cervical lordosis.  Past Medical History:  Diagnosis Date  . Allergy   . Anemia   . Anxiety   . Chronic back pain   . Depression   . Diabetes mellitus without complication ([place])   . Fibroids   . Hyperlipidemia   . Hypertension   . Menorrhagia   . Migraines    not on medication  . Morbid obesity ([place])   . Sleep apnea     Past Surgical History:  Procedure Laterality Date  . ABDOMINAL HYSTERECTOMY  07/14/2012   Procedure: HYSTERECTOMY ABDOMINAL;  Surgeon: Emily Filbert, MD;  Location: [facility];  Service: Gynecology;  Laterality: N/A;  . CESAREAN SECTION     x2  . endomerial ablation    . ENDOMETRIAL ABLATION    . LEFT HEART CATH AND CORONARY ANGIOGRAPHY N/A 06/11/2017   Procedure: LEFT HEART CATH AND CORONARY ANGIOGRAPHY;  Surgeon: Charolette Forward, MD;  Location: [facility];  Service: Cardiovascular;  Laterality: N/A;  . NECK SURGERY     twice  . ROBOTIC ASSISTED LAPAROSCOPIC LYSIS OF ADHESION  07/14/2012   Procedure: ROBOTIC ASSISTED LAPAROSCOPIC LYSIS OF ADHESION;  Surgeon: Emily Filbert, MD;  Location: [facility];  Service: Gynecology;  Laterality: N/A;     Medications:  Outpatient Encounter Medications as of 09/21/2017  Medication Sig Note  . aspirin EC 81 MG tablet Take 81 mg by mouth daily.   . baclofen (LIORESAL) 10 MG tablet Take 5 mg by mouth 2 (two) times daily as needed.    Marland Kitchen buPROPion (WELLBUTRIN XL) 150 MG  24 hr tablet TAKE 1 TABLET BY MOUTH EVERY DAY IN THE MORNING   . clonazePAM (KLONOPIN) 0.5 MG tablet Take 0.5 mg by mouth 2 (two) times daily as needed.   . ferrous sulfate 325 (65 FE) MG tablet Take 325 mg by mouth daily with breakfast.   . folic acid (FOLVITE) 825 MCG tablet Take 800 mcg by mouth daily.    Marland Kitchen losartan (COZAAR) 50 MG tablet Take 25 mg by mouth daily. 06/09/2017: Reduced to 25 mg recently  . metoprolol succinate (TOPROL-XL) 50 MG 24 hr tablet Take 50 mg by mouth daily.   . nitroGLYCERIN (NITROSTAT) 0.4 MG SL tablet Place 0.4 mg under the tongue every 5 (five) minutes as needed for chest pain.   . simvastatin (ZOCOR) 40 MG tablet Take 40 mg by mouth at bedtime.     . topiramate (TOPAMAX) 100 MG tablet Take 100 mg by mouth 2 (two) times daily.   . TRADJENTA 5 MG TABS tablet Take 5 mg by mouth daily.   . traMADol (ULTRAM) 50 MG tablet Take 50 mg by mouth every 6 (six) hours as needed for moderate pain.    . vitamin B-12 (CYANOCOBALAMIN) 500 MCG tablet Take 500 mcg by mouth daily.   . DULoxetine (CYMBALTA) 60 MG capsule Take 60 mg by mouth daily.      No facility-administered encounter medications on file as of 09/21/2017.      Allergies:  Allergies  Allergen Reactions  . Adhesive [Tape] Rash, Itching and Swelling    Use paper tape Use paper tape  . Cortisone Anxiety, Other (See Comments), Hives, Itching, Nausea And Vomiting, Palpitations, Rash and Shortness Of Breath  . Naproxen Hives  . Penicillins Hives    Has patient had a PCN reaction causing immediate rash, facial/tongue/throat swelling, SOB or lightheadedness with hypotension: yes   Has patient had a PCN reaction causing severe rash involving mucus membranes or skin necrosis: no Has patient had a PCN reaction that required hospitalization: unknown Has patient had a PCN reaction occurring within the last 10 years: no If all of the above answers are "NO", then may proceed with Cephalosporin use.Doctor says to try  cefazolin     Family History: Family History  Problem Relation Age of Onset  . Hypertension Mother   . Hypertension Maternal Grandmother   . Heart disease Maternal Grandmother   . Hypertension Maternal Uncle   . Breast cancer Neg Hx   Father died from skin cancer.    Social History: Social History   Tobacco Use  . Smoking status: Never Smoker  . Smokeless tobacco: Never Used  Substance Use Topics  . Alcohol use: Yes    Comment: 1 glss every 2-3 months  . Drug use: No    Comment: occasional; hx   Social History   Social History Narrative  Patient is blind with prosthetic in R eye, daughter is support system.  Lives alone in a one story home.  Has 2 grown children.  On disability for depression.  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  Education: associates degree.      Review of Systems:  CONSTITUTIONAL: No fevers, chills, night sweats, or weight loss.   EYES: No visual changes or eye pain ENT: No hearing changes.  No history of nose bleeds.   RESPIRATORY: No cough, wheezing and shortness of breath.   CARDIOVASCULAR: Negative for chest pain, and palpitations.   GI: Negative for abdominal discomfort, blood in stools or black stools.  No recent change in bowel habits.   GU:  No history of incontinence.   MUSCLOSKELETAL: No history of joint pain or swelling.  +myalgias.   SKIN: Negative for lesions, rash, and itching.   HEMATOLOGY/ONCOLOGY: Negative for prolonged bleeding, bruising easily, and swollen nodes.  No history of cancer.   ENDOCRINE: Negative for cold or heat intolerance, polydipsia or goiter.   PSYCH:  +depression or anxiety symptoms.   NEURO: As Above.   Vital Signs:  BP 104/74   Pulse 85   Ht 5\' 4"  (1.626 m)   Wt 225 lb (102.1 kg)   LMP 03/30/2011   SpO2 98%   BMI 38.62 kg/m    General Medical Exam:   General:  Well appearing, comfortable.   Eyes/ENT: see cranial nerve examination.   Neck: No masses appreciated.  Full range of  motion without tenderness.  No carotid bruits. Respiratory:  Clear to auscultation, good air entry bilaterally.   Cardiac:  Regular rate and rhythm, no murmur.   Extremities:  No deformities, edema, or skin discoloration.  Skin:  No rashes or lesions.  Neurological Exam: MENTAL STATUS including orientation to time, place, person, recent and remote memory, attention span and concentration, language, and fund of knowledge is fair.  She is very slow to process simple commands or answer simple questions.  Speech is not dysarthric.  CRANIAL NERVES: II:  No visual field defects on the left eye, unremarkable fundi.  Right eye is prosthetic.  III-IV-VI: Left pupil is round and reactive to light.  Extraocular muscle movement on the left is intact. V:  Normal facial sensation.   VII:  Normal facial symmetry and movements.   VIII:  Normal hearing and vestibular function.   IX-X:  Normal palatal movement.   XI:  Normal shoulder shrug and head rotation.   XII:  Normal tongue strength and range of motion, no deviation or fasciculation.  MOTOR:  Motor strength testing is 5/5 throughout all extremities, but she complains of tenderness with palpation of the muscle or application of pressure in the muscle groups.    No atrophy, fasciculations or abnormal movements.  No pronator drift.  Tone is normal.    MSRs:  Right                                                                 Left brachioradialis 2+  brachioradialis 2+  biceps 2+  biceps 2+  triceps 2+  triceps 2+  patellar 1+  patellar 1+  ankle jerk 0  ankle jerk 0  Hoffman no  Hoffman no  plantar response down  plantar response down   SENSORY:    Normal and symmetric perception of light touch, pinprick, vibration, and proprioception.  Romberg's sign absent.   COORDINATION/GAIT: Normal finger-to- nose-finger.  Intact rapid alternating movements bilaterally.  Gait slightly wide-based, slow, unassisted. She is able to perform tandem  gait.   IMPRESSION: Generalized paresthesias of the entire arms >> feet, which does not fit to a cutaneous nerve or dermatomal distribution.  She has history of cervical surgery for herniated disc x 2, but does not have any myelopathic findings on exam.  Feet paresthesias may be early signs of diabetic neuropathy, however sensory exam was entirely intact.  Therefore, will proceed with NCS/EMG of the right arm and leg to better characterize the nature of her symptoms. Check vitamin B12, folate, TSH  Fibromyalgia, followed by Dr. Rockney Ghee, [facility].  She has tenderness to palpation of the muscles during motor strength testing, which suggests that at least some of her pain is secondary to fibromyalgia.    Migraines, followed by Dr Olevia Bowens, [facility]. She takes topiramate 100mg /d which can also manifest with generalized paresthesias, but usually this is distal or involving the face.  If her NCS/EMG does not show evidence of neuropathy or radiculopathy, would recommend that she discuss symptoms with her headache specialists and consider tapering the dose.     Thank you for allowing me to participate in patient's care.  If I can answer any additional questions, I would be pleased to do so.    Sincerely,    Di Jasmer K. Posey Pronto, DO

## 2017-09-21 NOTE — Patient Instructions (Addendum)
Nerve testing of the right arm and leg.  Please do not apply any lotion on the day of the testing.  Check labs

## 2017-10-08 ENCOUNTER — Ambulatory Visit (INDEPENDENT_AMBULATORY_CARE_PROVIDER_SITE_OTHER): Payer: Medicare Other | Admitting: Neurology

## 2017-10-08 DIAGNOSIS — G5601 Carpal tunnel syndrome, right upper limb: Secondary | ICD-10-CM

## 2017-10-08 DIAGNOSIS — R202 Paresthesia of skin: Secondary | ICD-10-CM

## 2017-10-08 DIAGNOSIS — M797 Fibromyalgia: Secondary | ICD-10-CM

## 2017-10-08 NOTE — Procedures (Signed)
[facility]  [address]  [place], [place] [address] Tel: [phone] Fax:  [phone] Test Date:  10/08/2017  Patient: Margaret [name] DOB: 03-29-1961 Physician: Narda Amber, DO  Sex: Female Height: 5\' 4"  Ref Phys: Narda Amber, DO  ID#: [id number] Temp: 34.5C Technician:    Patient Complaints: This is a 57 year old female referred for evaluation of generalized paresthesias of the arms and legs.  NCV & EMG Findings: Extensive electrodiagnostic testing of the right upper and lower extremity shows:  1. Right median sensory response shows prolonged distal peak latency (3.8 ms) and normal amplitude. Right ulnar, sural, and superficial peroneal nerves are within normal limits. 2. Right median, ulnar, peroneal, and tibial motor responses are within normal limits. 3. Right tibial H reflex study is within normal limits. 4. There is no evidence of active or chronic motor axon loss changes affecting any of the tested muscles. Motor unit configuration and recruitment pattern is within normal limits.  Impression: 1. Right median neuropathy at or distal to the wrist, consistent with the clinical diagnosis of carpal tunnel syndrome; mild in degree electrically. 2. There is no evidence of a sensorimotor polyneuropathy, diffuse myopathy, or cervical/lumbosacral radiculopathy affecting the right side.   ___________________________ Narda Amber, DO    Nerve Conduction Studies Anti Sensory Summary Table   Site NR Peak (ms) Norm Peak (ms) P-T Amp (V) Norm P-T Amp  Right Median Anti Sensory (2nd Digit)  Wrist    3.8 <3.6 26.1 >15  Right Sup Peroneal Anti Sensory (Ant Lat Mall)  12 cm    2.2 <4.6 10.9 >4  Right Sural Anti Sensory (Lat Mall)  Calf    2.7 <4.6 16.1 >4  Right Ulnar Anti Sensory (5th Digit)  Wrist    2.4 <3.1 30.3 >10   Motor Summary Table   Site NR Onset (ms) Norm Onset (ms) O-P Amp (mV) Norm O-P Amp Site1 Site2 Delta-0 (ms) Dist (cm) Vel (m/s) Norm Vel  (m/s)  Right Median Motor (Abd Poll Brev)  Wrist    3.5 <4.0 6.5 >6 Elbow Wrist 5.6 29.0 52 >50  Elbow    9.1  6.4         Right Peroneal Motor (Ext Dig Brev)  Ankle    3.3 <6.0 6.9 >2.5 B Fib Ankle 6.7 36.0 54 >40  B Fib    10.0  6.1  Poplt B Fib 1.4 9.0 64 >40  Poplt    11.4  5.8         Right Tibial Motor (Abd Hall Brev)  Ankle    4.6 <6.0 4.5 >4 Knee Ankle 8.3 40.0 48 >40  Knee    12.9  4.5         Right Ulnar Motor (Abd Dig Minimi)  Wrist    2.3 <3.1 8.2 >7 B Elbow Wrist 3.8 25.0 66 >50  B Elbow    6.1  7.6  A Elbow B Elbow 1.6 10.0 62 >50  A Elbow    7.7  7.4          H Reflex Studies   NR H-Lat (ms) Lat Norm (ms) L-R H-Lat (ms)  Right Tibial (Gastroc)     34.01 <35    EMG   Side Muscle Ins Act Fibs Psw Fasc Number Recrt Dur Dur. Amp Amp. Poly Poly. Comment  Right AntTibialis Nml Nml Nml Nml Nml Nml Nml Nml Nml Nml Nml Nml N/A  Right Gastroc Nml Nml Nml Nml Nml Nml Nml  Nml Nml Nml Nml Nml N/A  Right Flex Dig Long Nml Nml Nml Nml Nml Nml Nml Nml Nml Nml Nml Nml N/A  Right RectFemoris Nml Nml Nml Nml Nml Nml Nml Nml Nml Nml Nml Nml N/A  Right GluteusMed Nml Nml Nml Nml Nml Nml Nml Nml Nml Nml Nml Nml N/A  Right BicepsFemS Nml Nml Nml Nml Nml Nml Nml Nml Nml Nml Nml Nml N/A  Right 1stDorInt Nml Nml Nml Nml Nml Nml Nml Nml Nml Nml Nml Nml N/A  Right Abd Poll Brev Nml Nml Nml Nml Nml Nml Nml Nml Nml Nml Nml Nml N/A  Right PronatorTeres Nml Nml Nml Nml Nml Nml Nml Nml Nml Nml Nml Nml N/A  Right Biceps Nml Nml Nml Nml Nml Nml Nml Nml Nml Nml Nml Nml N/A  Right Triceps Nml Nml Nml Nml Nml Nml Nml Nml Nml Nml Nml Nml N/A  Right Deltoid Nml Nml Nml Nml Nml Nml Nml Nml Nml Nml Nml Nml N/A      Waveforms:

## 2017-10-09 ENCOUNTER — Telehealth: Payer: Self-pay | Admitting: *Deleted

## 2017-10-09 NOTE — Telephone Encounter (Signed)
Patient given results and instructions.   

## 2017-10-09 NOTE — Telephone Encounter (Signed)
-----   Message from Alda Berthold, DO sent at 10/09/2017  9:50 AM EST ----- Please call and inform patient that her nerve testing shows right carpal tunnel syndrome, which is mild and to start using a wrist splint to keep the wrist straight.  Nerve testing in the legs is normal  - there is no signs of neuropathy or nerve impingement from the back. Recommend follow-up with her headache specialist at [facility] about possibility of her medications (topiramate) causing the tingling.  Her pain is due to fibromyalgia, there is no muscle injury.

## 2018-02-02 ENCOUNTER — Other Ambulatory Visit (HOSPITAL_COMMUNITY): Payer: Self-pay | Admitting: Obstetrics & Gynecology

## 2018-02-02 DIAGNOSIS — Z1231 Encounter for screening mammogram for malignant neoplasm of breast: Secondary | ICD-10-CM

## 2018-02-24 ENCOUNTER — Ambulatory Visit
Admission: RE | Admit: 2018-02-24 | Discharge: 2018-02-24 | Disposition: A | Discharge status: Home / Self Care | Payer: Medicare Other | Source: Ambulatory Visit | Attending: Obstetrics & Gynecology | Admitting: Obstetrics & Gynecology

## 2018-02-24 DIAGNOSIS — Z1231 Encounter for screening mammogram for malignant neoplasm of breast: Secondary | ICD-10-CM

## 2018-02-25 ENCOUNTER — Other Ambulatory Visit (HOSPITAL_COMMUNITY): Payer: Self-pay | Admitting: Obstetrics & Gynecology

## 2018-02-25 DIAGNOSIS — R921 Mammographic calcification found on diagnostic imaging of breast: Secondary | ICD-10-CM

## 2018-03-01 ENCOUNTER — Ambulatory Visit
Admission: RE | Admit: 2018-03-01 | Discharge: 2018-03-01 | Disposition: A | Discharge status: Home / Self Care | Payer: Medicare Other | Source: Ambulatory Visit | Attending: Obstetrics & Gynecology | Admitting: Obstetrics & Gynecology

## 2018-03-01 DIAGNOSIS — R921 Mammographic calcification found on diagnostic imaging of breast: Secondary | ICD-10-CM

## 2018-03-09 ENCOUNTER — Encounter

## 2018-04-21 ENCOUNTER — Ambulatory Visit (INDEPENDENT_AMBULATORY_CARE_PROVIDER_SITE_OTHER): Payer: Medicare Other | Admitting: Obstetrics & Gynecology

## 2018-04-21 ENCOUNTER — Encounter: Payer: Self-pay | Admitting: Obstetrics & Gynecology

## 2018-04-21 VITALS — BP 126/78 | HR 64 | Wt 226.1 lb

## 2018-04-21 DIAGNOSIS — A609 Anogenital herpesviral infection, unspecified: Secondary | ICD-10-CM

## 2018-04-21 DIAGNOSIS — B009 Herpesviral infection, unspecified: Secondary | ICD-10-CM | POA: Insufficient documentation

## 2018-04-21 MED ORDER — VALACYCLOVIR HCL 1 G PO TABS: 1000.0000 mg | ORAL_TABLET | Freq: Two times a day (BID) | ORAL | 3 refills | Status: DC

## 2018-04-21 MED ORDER — VALACYCLOVIR HCL 1 G PO TABS
1000.0000 mg | ORAL_TABLET | Freq: Two times a day (BID) | ORAL | 3 refills | Status: DC
Start: 2018-04-21 — End: 2018-04-21

## 2018-04-21 NOTE — Progress Notes (Signed)
   Subjective:    Patient ID: Margaret [name], female    DOB: [birth year], 57 y.o.   MRN: [id number]  HPI 57 yo separated AA P2 here with the issue of a itchy "rash" on her vulva on the right. She tried Monistat but still has the issue. She tells me that she was treated for HSV in the past, but forgot about it.    Review of Systems     Objective:   Physical Exam Breathing, conversing, and ambulating normally Well nourished, well hydrated Black female, no apparent distress Right labia/right buttock lesion c/w HSV Speculum exam reveals normal vagina, cuff, and discharge     Assessment & Plan:   HSV- treat with valtrex

## 2019-03-02 ENCOUNTER — Other Ambulatory Visit: Payer: Self-pay | Admitting: Obstetrics & Gynecology

## 2019-03-02 DIAGNOSIS — Z1231 Encounter for screening mammogram for malignant neoplasm of breast: Secondary | ICD-10-CM

## 2019-04-18 ENCOUNTER — Ambulatory Visit
Admission: RE | Admit: 2019-04-18 | Discharge: 2019-04-18 | Disposition: A | Discharge status: Home / Self Care | Payer: Medicare Other | Source: Ambulatory Visit | Attending: Obstetrics & Gynecology | Admitting: Obstetrics & Gynecology

## 2019-04-18 ENCOUNTER — Other Ambulatory Visit: Payer: Self-pay

## 2019-04-18 DIAGNOSIS — Z1231 Encounter for screening mammogram for malignant neoplasm of breast: Secondary | ICD-10-CM

## 2019-08-10 ENCOUNTER — Other Ambulatory Visit: Payer: Self-pay

## 2019-08-10 ENCOUNTER — Telehealth: Payer: Self-pay | Admitting: Obstetrics & Gynecology

## 2019-08-10 MED ORDER — VALACYCLOVIR HCL 1 G PO TABS: 1000.0000 mg | ORAL_TABLET | Freq: Two times a day (BID) | ORAL | 3 refills | Status: DC

## 2019-08-10 NOTE — Telephone Encounter (Signed)
Patient has requested a call back from clinical staff. She has questions about something that's going on with her.

## 2019-08-10 NOTE — Telephone Encounter (Signed)
Called pt to find out what she needed, she stated needed her Valtrex refilled & also she had questions of cramping after an COLONOSCOPY. Advised pt to contact that Dr. Abbott Pao verbalized understanding.

## 2019-08-23 ENCOUNTER — Ambulatory Visit (HOSPITAL_COMMUNITY): Payer: Medicare Other | Admitting: Anesthesiology

## 2019-08-23 ENCOUNTER — Ambulatory Visit (HOSPITAL_COMMUNITY)
Admission: RE | Admit: 2019-08-23 | Discharge: 2019-08-23 | Disposition: A | Discharge status: Home / Self Care | Payer: Medicare Other | Source: Other Acute Inpatient Hospital | Attending: Orthopedic Surgery | Admitting: Orthopedic Surgery

## 2019-08-23 ENCOUNTER — Other Ambulatory Visit: Payer: Self-pay

## 2019-08-23 ENCOUNTER — Encounter (HOSPITAL_COMMUNITY): Admission: RE | Disposition: A | Discharge status: Home / Self Care | Payer: Self-pay | Attending: Orthopedic Surgery

## 2019-08-23 ENCOUNTER — Encounter (HOSPITAL_COMMUNITY): Payer: Self-pay | Admitting: Orthopedic Surgery

## 2019-08-23 ENCOUNTER — Other Ambulatory Visit: Payer: Self-pay | Admitting: Orthopedic Surgery

## 2019-08-23 DIAGNOSIS — Z79899 Other long term (current) drug therapy: Secondary | ICD-10-CM | POA: Diagnosis not present

## 2019-08-23 DIAGNOSIS — Z886 Allergy status to analgesic agent status: Secondary | ICD-10-CM | POA: Diagnosis not present

## 2019-08-23 DIAGNOSIS — L03012 Cellulitis of left finger: Secondary | ICD-10-CM | POA: Diagnosis not present

## 2019-08-23 DIAGNOSIS — M170 Bilateral primary osteoarthritis of knee: Secondary | ICD-10-CM | POA: Diagnosis not present

## 2019-08-23 DIAGNOSIS — E119 Type 2 diabetes mellitus without complications: Secondary | ICD-10-CM | POA: Diagnosis not present

## 2019-08-23 DIAGNOSIS — F329 Major depressive disorder, single episode, unspecified: Secondary | ICD-10-CM | POA: Insufficient documentation

## 2019-08-23 DIAGNOSIS — F419 Anxiety disorder, unspecified: Secondary | ICD-10-CM | POA: Insufficient documentation

## 2019-08-23 DIAGNOSIS — M797 Fibromyalgia: Secondary | ICD-10-CM | POA: Diagnosis not present

## 2019-08-23 DIAGNOSIS — Z20828 Contact with and (suspected) exposure to other viral communicable diseases: Secondary | ICD-10-CM | POA: Diagnosis not present

## 2019-08-23 DIAGNOSIS — Z7984 Long term (current) use of oral hypoglycemic drugs: Secondary | ICD-10-CM | POA: Diagnosis not present

## 2019-08-23 DIAGNOSIS — E785 Hyperlipidemia, unspecified: Secondary | ICD-10-CM | POA: Insufficient documentation

## 2019-08-23 DIAGNOSIS — G4730 Sleep apnea, unspecified: Secondary | ICD-10-CM | POA: Insufficient documentation

## 2019-08-23 DIAGNOSIS — I10 Essential (primary) hypertension: Secondary | ICD-10-CM | POA: Insufficient documentation

## 2019-08-23 DIAGNOSIS — M19022 Primary osteoarthritis, left elbow: Secondary | ICD-10-CM | POA: Diagnosis not present

## 2019-08-23 DIAGNOSIS — Z7982 Long term (current) use of aspirin: Secondary | ICD-10-CM | POA: Diagnosis not present

## 2019-08-23 DIAGNOSIS — Z6841 Body Mass Index (BMI) 40.0 and over, adult: Secondary | ICD-10-CM | POA: Diagnosis not present

## 2019-08-23 DIAGNOSIS — Z880 Allergy status to penicillin: Secondary | ICD-10-CM | POA: Insufficient documentation

## 2019-08-23 DIAGNOSIS — M19021 Primary osteoarthritis, right elbow: Secondary | ICD-10-CM | POA: Diagnosis not present

## 2019-08-23 HISTORY — DX: Unspecified osteoarthritis, unspecified site: M19.90

## 2019-08-23 HISTORY — DX: Herpesviral infection, unspecified: B00.9

## 2019-08-23 HISTORY — DX: Personal history of other medical treatment: Z92.89

## 2019-08-23 HISTORY — DX: Fibromyalgia: M79.7

## 2019-08-23 HISTORY — DX: Carpal tunnel syndrome, right upper limb: G56.01

## 2019-08-23 HISTORY — PX: I & D EXTREMITY: SHX5045

## 2019-08-23 LAB — RESPIRATORY PANEL BY RT PCR (FLU A&B, COVID)
Influenza A by PCR: NEGATIVE
Influenza B by PCR: NEGATIVE
SARS Coronavirus 2 by RT PCR: NEGATIVE

## 2019-08-23 LAB — BASIC METABOLIC PANEL
Anion gap: 13 (ref 5–15)
BUN: 16 mg/dL (ref 6–20)
CO2: 21 mmol/L — ABNORMAL LOW (ref 22–32)
Calcium: 9.1 mg/dL (ref 8.9–10.3)
Chloride: 107 mmol/L (ref 98–111)
Creatinine, Ser: 1.01 mg/dL — ABNORMAL HIGH (ref 0.44–1.00)
GFR calc Af Amer: >60 mL/min (ref >60)
GFR calc non Af Amer: >60 mL/min (ref >60)
Glucose, Bld: 88 mg/dL (ref 70–99)
Potassium: 4.2 mmol/L (ref 3.5–5.1)
Sodium: 141 mmol/L (ref 135–145)

## 2019-08-23 LAB — CBC
HCT: 39.9 % (ref 36.0–46.0)
Hemoglobin: 11.9 g/dL — ABNORMAL LOW (ref 12.0–15.0)
MCH: 25.3 pg — ABNORMAL LOW (ref 26.0–34.0)
MCHC: 29.8 g/dL — ABNORMAL LOW (ref 30.0–36.0)
MCV: 84.9 fL (ref 80.0–100.0)
Platelets: 377 10*3/uL (ref 150–400)
RBC: 4.7 MIL/uL (ref 3.87–5.11)
RDW: 15.3 % (ref 11.5–15.5)
WBC: 10.8 10*3/uL — ABNORMAL HIGH (ref 4.0–10.5)
nRBC: 0 % (ref 0.0–0.2)

## 2019-08-23 LAB — GLUCOSE, CAPILLARY: Glucose-Capillary: 139 mg/dL — ABNORMAL HIGH (ref 70–99)

## 2019-08-23 SURGERY — IRRIGATION AND DEBRIDEMENT EXTREMITY
Anesthesia: General | Site: Finger | Laterality: Left

## 2019-08-23 MED ORDER — VANCOMYCIN HCL 1000 MG IV SOLR
INTRAVENOUS | Status: DC | PRN
  Administered 2019-08-23: 1000 mg via INTRAVENOUS

## 2019-08-23 MED ORDER — SUCCINYLCHOLINE CHLORIDE 20 MG/ML IJ SOLN
INTRAMUSCULAR | Status: DC | PRN
  Administered 2019-08-23: 120 mg via INTRAVENOUS

## 2019-08-23 MED ORDER — HYDROCODONE-ACETAMINOPHEN 5-325 MG PO TABS: ORAL_TABLET | ORAL | 0 refills | Status: DC

## 2019-08-23 MED ORDER — MIDAZOLAM HCL 5 MG/5ML IJ SOLN
INTRAMUSCULAR | Status: DC | PRN
  Administered 2019-08-23: 1 mg via INTRAVENOUS

## 2019-08-23 MED ORDER — PROPOFOL 10 MG/ML IV BOLUS
INTRAVENOUS | Status: AC
  Filled 2019-08-23: qty 20

## 2019-08-23 MED ORDER — FENTANYL CITRATE (PF) 250 MCG/5ML IJ SOLN
INTRAMUSCULAR | Status: AC
  Filled 2019-08-23: qty 5

## 2019-08-23 MED ORDER — VANCOMYCIN HCL IN DEXTROSE 1-5 GM/200ML-% IV SOLN
INTRAVENOUS | Status: AC
  Filled 2019-08-23: qty 200

## 2019-08-23 MED ORDER — SULFAMETHOXAZOLE-TRIMETHOPRIM 800-160 MG PO TABS: 1.0000 | ORAL_TABLET | Freq: Two times a day (BID) | ORAL | 0 refills | Status: DC

## 2019-08-23 MED ORDER — FENTANYL CITRATE (PF) 100 MCG/2ML IJ SOLN: 25.0000 ug | INTRAMUSCULAR | Status: DC | PRN

## 2019-08-23 MED ORDER — BUPIVACAINE HCL (PF) 0.25 % IJ SOLN
INTRAMUSCULAR | Status: DC | PRN
  Administered 2019-08-23: 10 mL

## 2019-08-23 MED ORDER — ONDANSETRON HCL 4 MG/2ML IJ SOLN
INTRAMUSCULAR | Status: DC | PRN
  Administered 2019-08-23: 4 mg via INTRAVENOUS

## 2019-08-23 MED ORDER — ALBUTEROL SULFATE HFA 108 (90 BASE) MCG/ACT IN AERS
INHALATION_SPRAY | RESPIRATORY_TRACT | Status: DC | PRN
  Administered 2019-08-23: 2 via RESPIRATORY_TRACT

## 2019-08-23 MED ORDER — MIDAZOLAM HCL 2 MG/2ML IJ SOLN
INTRAMUSCULAR | Status: AC
  Filled 2019-08-23: qty 2

## 2019-08-23 MED ORDER — LIDOCAINE HCL (CARDIAC) PF 100 MG/5ML IV SOSY
PREFILLED_SYRINGE | INTRAVENOUS | Status: DC | PRN
  Administered 2019-08-23: 30 mg via INTRAVENOUS

## 2019-08-23 MED ORDER — CEFAZOLIN SODIUM 1 G IJ SOLR
INTRAMUSCULAR | Status: AC
  Filled 2019-08-23: qty 20

## 2019-08-23 MED ORDER — LACTATED RINGERS IV SOLN
INTRAVENOUS | Status: DC
  Administered 2019-08-23: 1000 mL via INTRAVENOUS

## 2019-08-23 MED ORDER — BUPIVACAINE HCL (PF) 0.25 % IJ SOLN
INTRAMUSCULAR | Status: AC
  Filled 2019-08-23: qty 30

## 2019-08-23 MED ORDER — ACETAMINOPHEN 500 MG PO TABS
1000.0000 mg | ORAL_TABLET | Freq: Once | ORAL | Status: AC
  Administered 2019-08-23: 1000 mg via ORAL
  Filled 2019-08-23: qty 2

## 2019-08-23 MED ORDER — PROPOFOL 10 MG/ML IV BOLUS
INTRAVENOUS | Status: DC | PRN
  Administered 2019-08-23: 150 mg via INTRAVENOUS

## 2019-08-23 MED ORDER — FENTANYL CITRATE (PF) 100 MCG/2ML IJ SOLN
INTRAMUSCULAR | Status: DC | PRN
  Administered 2019-08-23: 50 ug via INTRAVENOUS
  Administered 2019-08-23: 100 ug via INTRAVENOUS

## 2019-08-23 MED ORDER — PROMETHAZINE HCL 25 MG/ML IJ SOLN: 6.2500 mg | INTRAMUSCULAR | Status: DC | PRN

## 2019-08-23 MED ORDER — 0.9 % SODIUM CHLORIDE (POUR BTL) OPTIME
TOPICAL | Status: DC | PRN
  Administered 2019-08-23: 1000 mL

## 2019-08-23 MED ORDER — LACTATED RINGERS IV SOLN: INTRAVENOUS | Status: DC | PRN

## 2019-08-23 MED ORDER — DEXAMETHASONE SODIUM PHOSPHATE 10 MG/ML IJ SOLN
INTRAMUSCULAR | Status: DC | PRN
  Administered 2019-08-23: 10 mg via INTRAVENOUS

## 2019-08-23 SURGICAL SUPPLY — 51 items
BNDG COHESIVE 1X5 TAN STRL LF (GAUZE/BANDAGES/DRESSINGS) ×2 IMPLANT
BNDG COHESIVE 2X5 TAN STRL LF (GAUZE/BANDAGES/DRESSINGS) IMPLANT
BNDG CONFORM 2 STRL LF (GAUZE/BANDAGES/DRESSINGS) IMPLANT
BNDG ELASTIC 3X5.8 VLCR STR LF (GAUZE/BANDAGES/DRESSINGS) IMPLANT
BNDG ELASTIC 4X5.8 VLCR STR LF (GAUZE/BANDAGES/DRESSINGS) IMPLANT
BNDG ESMARK 4X9 LF (GAUZE/BANDAGES/DRESSINGS) ×2 IMPLANT
BNDG GAUZE ELAST 4 BULKY (GAUZE/BANDAGES/DRESSINGS) IMPLANT
CORD BIPOLAR FORCEPS 12FT (ELECTRODE) ×2 IMPLANT
COVER SURGICAL LIGHT HANDLE (MISCELLANEOUS) ×2 IMPLANT
COVER WAND RF STERILE (DRAPES) IMPLANT
CUFF TOURN SGL QUICK 18X4 (TOURNIQUET CUFF) IMPLANT
CUFF TOURN SGL QUICK 24 (TOURNIQUET CUFF) ×1
CUFF TRNQT CYL 24X4X16.5-23 (TOURNIQUET CUFF) ×1 IMPLANT
DECANTER SPIKE VIAL GLASS SM (MISCELLANEOUS) IMPLANT
DRAIN PENROSE 1/4X12 LTX STRL (WOUND CARE) IMPLANT
DRSG PAD ABDOMINAL 8X10 ST (GAUZE/BANDAGES/DRESSINGS) ×4 IMPLANT
DRSG XEROFORM 1X8 (GAUZE/BANDAGES/DRESSINGS) ×2 IMPLANT
GAUZE PACKING IODOFORM 1/4X15 (GAUZE/BANDAGES/DRESSINGS) ×2 IMPLANT
GAUZE SPONGE 4X4 12PLY STRL (GAUZE/BANDAGES/DRESSINGS) IMPLANT
GAUZE SPONGE 4X4 12PLY STRL LF (GAUZE/BANDAGES/DRESSINGS) ×2 IMPLANT
GAUZE XEROFORM 1X8 LF (GAUZE/BANDAGES/DRESSINGS) IMPLANT
GLOVE BIO SURGEON STRL SZ7.5 (GLOVE) ×4 IMPLANT
GLOVE BIOGEL PI IND STRL 8 (GLOVE) ×2 IMPLANT
GLOVE BIOGEL PI INDICATOR 8 (GLOVE) ×2
GOWN STRL REUS W/ TWL LRG LVL3 (GOWN DISPOSABLE) ×1 IMPLANT
GOWN STRL REUS W/ TWL XL LVL3 (GOWN DISPOSABLE) ×1 IMPLANT
GOWN STRL REUS W/TWL LRG LVL3 (GOWN DISPOSABLE) ×1
GOWN STRL REUS W/TWL XL LVL3 (GOWN DISPOSABLE) ×1
KIT BASIN OR (CUSTOM PROCEDURE TRAY) ×2 IMPLANT
KIT TURNOVER KIT B (KITS) ×2 IMPLANT
LOOP VESSEL MAXI BLUE (MISCELLANEOUS) IMPLANT
MANIFOLD NEPTUNE II (INSTRUMENTS) ×2 IMPLANT
NEEDLE HYPO 25X1 1.5 SAFETY (NEEDLE) ×2 IMPLANT
NS IRRIG 1000ML POUR BTL (IV SOLUTION) ×2 IMPLANT
PACK ORTHO EXTREMITY (CUSTOM PROCEDURE TRAY) ×2 IMPLANT
PAD ARMBOARD 7.5X6 YLW CONV (MISCELLANEOUS) ×4 IMPLANT
SET CYSTO W/LG BORE CLAMP LF (SET/KITS/TRAYS/PACK) IMPLANT
SOL PREP POV-IOD 4OZ 10% (MISCELLANEOUS) ×4 IMPLANT
SPLINT FINGER W/BULB (SOFTGOODS) ×2 IMPLANT
SPONGE LAP 4X18 RFD (DISPOSABLE) IMPLANT
SUT ETHILON 4 0 P 3 18 (SUTURE) IMPLANT
SUT ETHILON 4 0 PS 2 18 (SUTURE) IMPLANT
SUT MON AB 5-0 P3 18 (SUTURE) IMPLANT
SWAB COLLECTION DEVICE MRSA (MISCELLANEOUS) ×2 IMPLANT
SWAB CULTURE ESWAB REG 1ML (MISCELLANEOUS) ×2 IMPLANT
SYR CONTROL 10ML LL (SYRINGE) ×2 IMPLANT
TOWEL GREEN STERILE (TOWEL DISPOSABLE) ×2 IMPLANT
TUBE CONNECTING 12X1/4 (SUCTIONS) ×2 IMPLANT
TUBE FEEDING ENTERAL 5FR 16IN (TUBING) IMPLANT
UNDERPAD 30X30 (UNDERPADS AND DIAPERS) ×2 IMPLANT
YANKAUER SUCT BULB TIP NO VENT (SUCTIONS) IMPLANT

## 2019-08-23 NOTE — H&P (Signed)
Margaret [name] is an 58 y.o. female.   Chief Complaint: left index finger infection HPI: 58 yo female states she has had issues with left index finger over the past week.  Progressively worsening pain and swelling.  Had artificial nails which were removed four days ago.  Seen by PCP this morning and referred for further care.  She reports throbbing pain of the finger.  Alleviated by nothing and aggravated by palpation or contact.  No fevers, chills, sweats.  Xrays viewed and interpreted by me: none Labs reviewed: none  Allergies:  Allergies  Allergen Reactions  . Adhesive [Tape] Rash, Itching and Swelling    Use paper tape Use paper tape  . Cortisone Anxiety, Other (See Comments), Hives, Itching, Nausea And Vomiting, Palpitations, Rash and Shortness Of Breath  . Naproxen Hives  . Penicillins Hives    Has patient had a PCN reaction causing immediate rash, facial/tongue/throat swelling, SOB or lightheadedness with hypotension: yes   Has patient had a PCN reaction causing severe rash involving mucus membranes or skin necrosis: no Has patient had a PCN reaction that required hospitalization: unknown Has patient had a PCN reaction occurring within the last 10 years: no If all of the above answers are "NO", then may proceed with Cephalosporin use.Doctor says to try cefazolin     Past Medical History:  Diagnosis Date  . Allergy   . Anemia   . Anxiety   . Arthritis    knees/elbows  . Chronic back pain   . Depression   . Diabetes mellitus without complication ([place])    type 2  . Fibroids   . Fibromyalgia   . History of blood product transfusion   . HSV infection   . Hyperlipidemia   . Hypertension   . Menorrhagia   . Migraines    not on medication  . Morbid obesity ([place])   . Right carpal tunnel syndrome   . Sleep apnea    does not use cpap    Past Surgical History:  Procedure Laterality Date  . ABDOMINAL HYSTERECTOMY  07/14/2012   Procedure: HYSTERECTOMY ABDOMINAL;  Surgeon:  Emily Filbert, MD;  Location: [facility];  Service: Gynecology;  Laterality: N/A;  . CESAREAN SECTION     x2  . COLONOSCOPY     normal  . endomerial ablation    . ENDOMETRIAL ABLATION    . LEFT HEART CATH AND CORONARY ANGIOGRAPHY N/A 06/11/2017   Procedure: LEFT HEART CATH AND CORONARY ANGIOGRAPHY;  Surgeon: Charolette Forward, MD;  Location: [facility];  Service: Cardiovascular;  Laterality: N/A;  . NECK SURGERY     twice  . ROBOTIC ASSISTED LAPAROSCOPIC LYSIS OF ADHESION  07/14/2012   Procedure: ROBOTIC ASSISTED LAPAROSCOPIC LYSIS OF ADHESION;  Surgeon: Emily Filbert, MD;  Location: [facility];  Service: Gynecology;  Laterality: N/A;  . TUBAL LIGATION    . WISDOM TOOTH EXTRACTION      Family History: Family History  Problem Relation Age of Onset  . Hypertension Mother   . Hypertension Maternal Grandmother   . Heart disease Maternal Grandmother   . Hypertension Maternal Uncle   . Breast cancer Neg Hx     Social History:   reports that she has never smoked. She has never used smokeless tobacco. She reports previous alcohol use. She reports that she does not use drugs.  Medications: Medications Prior to Admission  Medication Sig Dispense Refill  . acetaminophen (TYLENOL) 500 MG tablet Take 1,000 mg by mouth daily.    Marland Kitchen  aspirin EC 81 MG tablet Take 81 mg by mouth daily.    . baclofen (LIORESAL) 10 MG tablet Take 5 mg by mouth 2 (two) times daily as needed for muscle spasms.     . DULoxetine (CYMBALTA) 60 MG capsule Take 60 mg by mouth daily.      . ferrous sulfate 325 (65 FE) MG tablet Take 325 mg by mouth daily with breakfast.    . losartan (COZAAR) 50 MG tablet Take 25 mg by mouth daily.  3  . metoprolol succinate (TOPROL-XL) 50 MG 24 hr tablet Take 50 mg by mouth daily.  2  . Multiple Vitamin (MULTIVITAMIN WITH MINERALS) TABS tablet Take 1 tablet by mouth daily.    . nitroGLYCERIN (NITROSTAT) 0.4 MG SL tablet Place 0.4 mg under the tongue every 5 (five) minutes as needed for chest  pain.    Marland Kitchen Propylene Glycol (SYSTANE BALANCE OP) Place 1 drop into the left eye 4 (four) times daily.     . simvastatin (ZOCOR) 40 MG tablet Take 40 mg by mouth at bedtime.      . topiramate (TOPAMAX) 100 MG tablet Take 100 mg by mouth 2 (two) times daily.  2  . TRADJENTA 5 MG TABS tablet Take 5 mg by mouth daily.  3  . traMADol (ULTRAM) 50 MG tablet Take 50 mg by mouth every 6 (six) hours as needed for moderate pain.     . valACYclovir (VALTREX) 1000 MG tablet Take 1 tablet (1,000 mg total) by mouth 2 (two) times daily. Take for ten days. (Patient taking differently: Take 1,000 mg by mouth daily as needed (For outbreak). ) 20 tablet 3    Results for orders placed or performed during the hospital encounter of 08/23/19 (from the past 48 hour(s))  Respiratory Panel by RT PCR (Flu A&B, Covid) - Nasopharyngeal Swab     Status: None   Collection Time: 08/23/19  4:02 PM   Specimen: Nasopharyngeal Swab  Result Value Ref Range   SARS Coronavirus 2 by RT PCR NEGATIVE NEGATIVE    Comment: (NOTE) SARS-CoV-2 target nucleic acids are NOT DETECTED. The SARS-CoV-2 RNA is generally detectable in upper respiratoy specimens during the acute phase of infection. The lowest concentration of SARS-CoV-2 viral copies this assay can detect is 131 copies/mL. A negative result does not preclude SARS-Cov-2 infection and should not be used as the sole basis for treatment or other patient management decisions. A negative result may occur with  improper specimen collection/handling, submission of specimen other than nasopharyngeal swab, presence of viral mutation(s) within the areas targeted by this assay, and inadequate number of viral copies (<131 copies/mL). A negative result must be combined with clinical observations, patient history, and epidemiological information. The expected result is Negative. Fact Sheet for Patients:  PinkCheek.be Fact Sheet for Healthcare Providers:   GravelBags.it This test is not yet ap proved or cleared by the [place] FDA and  has been authorized for detection and/or diagnosis of SARS-CoV-2 by FDA under an Emergency Use Authorization (EUA). This EUA will remain  in effect (meaning this test can be used) for the duration of the COVID-19 declaration under Section 564(b)(1) of the Act, 21 U.S.C. section 360bbb-3(b)(1), unless the authorization is terminated or revoked sooner.    Influenza A by PCR NEGATIVE NEGATIVE   Influenza B by PCR NEGATIVE NEGATIVE    Comment: (NOTE) The Xpert Xpress SARS-CoV-2/FLU/RSV assay is intended as an aid in  the diagnosis of influenza from Nasopharyngeal swab specimens and  should not be used as a sole basis for treatment. Nasal washings and  aspirates are unacceptable for Xpert Xpress SARS-CoV-2/FLU/RSV  testing. Fact Sheet for Patients: PinkCheek.be Fact Sheet for Healthcare Providers: GravelBags.it This test is not yet approved or cleared by the [place] FDA and  has been authorized for detection and/or diagnosis of SARS-CoV-2 by  FDA under an Emergency Use Authorization (EUA). This EUA will remain  in effect (meaning this test can be used) for the duration of the  Covid-19 declaration under Section 564(b)(1) of the Act, 21  U.S.C. section 360bbb-3(b)(1), unless the authorization is  terminated or revoked. Performed at [facility], [place] [address], [place], [place] [id number]   Basic metabolic panel     Status: Abnormal   Collection Time: 08/23/19  4:21 PM  Result Value Ref Range   Sodium 141 135 - 145 mmol/L   Potassium 4.2 3.5 - 5.1 mmol/L   Chloride 107 98 - 111 mmol/L   CO2 21 (L) 22 - 32 mmol/L   Glucose, Bld 88 70 - 99 mg/dL   BUN 16 6 - 20 mg/dL   Creatinine, Ser 1.01 (H) 0.44 - 1.00 mg/dL   Calcium 9.1 8.9 - 10.3 mg/dL   GFR calc non Af Amer >60 >60 mL/min   GFR calc Af Amer >60  >60 mL/min   Anion gap 13 5 - 15    Comment: Performed at [facility] [address] [place], [place] [address]  CBC     Status: Abnormal   Collection Time: 08/23/19  4:21 PM  Result Value Ref Range   WBC 10.8 (H) 4.0 - 10.5 K/uL   RBC 4.70 3.87 - 5.11 MIL/uL   Hemoglobin 11.9 (L) 12.0 - 15.0 g/dL   HCT 39.9 36.0 - 46.0 %   MCV 84.9 80.0 - 100.0 fL   MCH 25.3 (L) 26.0 - 34.0 pg   MCHC 29.8 (L) 30.0 - 36.0 g/dL   RDW 15.3 11.5 - 15.5 %   Platelets 377 150 - 400 K/uL   nRBC 0.0 0.0 - 0.2 %    Comment: Performed at [facility], [place] [address] [place], [place] [address]    No results found.   A comprehensive review of systems was negative except for: Hematologic/lymphatic: positive for easy bruising Review of Systems: No fevers, chills, night sweats, chest pain, shortness of breath, nausea, vomiting, diarrhea, constipation, headaches, dizziness, vision changes, fainting.   Blood pressure 125/72, pulse 83, temperature 98.4 F (36.9 C), temperature source Temporal, resp. rate 18, height 5\' 5"  (1.651 m), weight 111.1 kg, last menstrual period 03/30/2011, SpO2 98 %.  General appearance: alert, cooperative and appears stated age Head: Normocephalic, without obvious abnormality, atraumatic Neck: supple, symmetrical, trachea midline Resp: clear to auscultation bilaterally Cardio: regular rate and rhythm Extremities: Intact sensation and capillary refill all digits.  +epl/fpl/io.  Left index finger with swelling of distal phalanx pad and discoloration under nail.  Purulence under skin at hyponychium.  Tender to palpation distal phalanx and dorsally over middle phalanx. Pulses: 2+ and symmetric Skin: Skin color, texture, turgor normal. No rashes or lesions Neurologic: Grossly normal Incision/Wound: none  Assessment/Plan Left index finger felon and paronychia.  Recommend OR for incision and drainage of left index finger including removal of nail and possibly incision over  middle phalanx.  Risks, benefits and alternatives of surgery were discussed including risks of blood loss, infection, damage to nerves/vessels/tendons/ligament/bone, failure of surgery, need for additional  surgery, complication with wound healing, stiffness, nail deformity.  She voiced understanding of these risks and elected to proceed.    Leanora Cover 08/23/2019, 6:39 PM

## 2019-08-23 NOTE — Transfer of Care (Signed)
Immediate Anesthesia Transfer of Care Note  Patient: Margaret [name]  Procedure(s) Performed: IRRIGATION AND DEBRIDEMENT EXTREMITY left index finger (Left Finger)  Patient Location: PACU  Anesthesia Type:General  Level of Consciousness: drowsy and patient cooperative  Airway & Oxygen Therapy: Patient Spontanous Breathing and Patient connected to face mask oxygen  Post-op Assessment: Report given to RN and Post -op Vital signs reviewed and stable   Post vital signs: Reviewed and stable  Last Vitals:  Vitals Value Taken Time  BP    Temp    Pulse    Resp    SpO2      Last Pain:  Vitals:   08/23/19 1702  TempSrc: Temporal  PainSc: 7       Patients Stated Pain Goal: 2 ([id number])  Complications: No apparent anesthesia complications

## 2019-08-23 NOTE — Discharge Instructions (Signed)

## 2019-08-23 NOTE — Anesthesia Procedure Notes (Signed)
Procedure Name: Intubation Date/Time: 08/23/2019 8:25 PM Performed by: Eligha Bridegroom, CRNA Pre-anesthesia Checklist: Emergency Drugs available, Suction available, Patient being monitored, Timeout performed and Patient identified Patient Re-evaluated:Patient Re-evaluated prior to induction Oxygen Delivery Method: Circle system utilized Preoxygenation: Pre-oxygenation with 100% oxygen Induction Type: IV induction, Rapid sequence and Cricoid Pressure applied Grade View: Grade I Tube type: Oral Tube size: 7.0 mm Airway Equipment and Method: Stylet Placement Confirmation: ETT inserted through vocal cords under direct vision,  positive ETCO2 and breath sounds checked- equal and bilateral Secured at: 22 cm Tube secured with: Tape Dental Injury: Teeth and Oropharynx as per pre-operative assessment

## 2019-08-23 NOTE — Op Note (Signed)
NAME: Margaret [name] RECORD NO: [id number] DATE OF BIRTH: 16-Mar-1961 FACILITY: [facility] LOCATION: [facility] OR PHYSICIAN: Tennis Must, MD   OPERATIVE REPORT   DATE OF PROCEDURE: 08/23/19    PREOPERATIVE DIAGNOSIS:   Left index finger felon and paronychia   POSTOPERATIVE DIAGNOSIS:   Left index finger felon and paronychia   PROCEDURE:   Left index finger incision and drainage of paronychia and felon   SURGEON:  Leanora Cover, M.D.   ASSISTANT: none   ANESTHESIA:  General   INTRAVENOUS FLUIDS:  Per anesthesia flow sheet.   ESTIMATED BLOOD LOSS:  Minimal.   COMPLICATIONS:  None.   SPECIMENS:   Cultures to micro   TOURNIQUET TIME:   Left arm: 21 minutes at 250 mmHg   DISPOSITION:  Stable to PACU.   INDICATIONS: 58 year old female states she has had progressive swelling and pain of the left index finger over the past week.  She had acrylic nails which were removed last week.  She has noted discoloration of the nail.  She is also tender over the dorsum of the middle phalanx and in the pad of the finger.  Recommend incision and drainage in the operating room. Risks, benefits and alternatives of surgery were discussed including the risks of blood loss, infection, damage to nerves, vessels, tendons, ligaments, bone for surgery, need for additional surgery, complications with wound healing, continued pain, stiffness.  She voiced understanding of these risks and elected to proceed.  OPERATIVE COURSE:  After being identified preoperatively by myself,  the patient and I agreed on the procedure and site of the procedure.  The surgical site was marked.  Surgical consent had been signed. She was given IV antibiotics as preoperative antibiotic prophylaxis. She was transferred to the operating room and placed on the operating table in supine position with the Left upper extremity on an arm board.  General anesthesia was induced by the anesthesiologist.  Left upper extremity was prepped and draped  in normal sterile orthopedic fashion.  A surgical pause was performed between the surgeons, anesthesia, and operating room staff and all were in agreement as to the patient, procedure, and site of procedure.  Tourniquet at the proximal aspect of the extremity was inflated to 250 mmHg after exsanguination of the arm with an Esmarch bandage.    Digital block was performed with 10 cc of quarter percent plain Marcaine to aid in postoperative analgesia.  The nail was removed with a freer elevator.  There was gross purulence under the nail.  Cultures were taken for aerobes and anaerobes.  She was given a dose of IV vancomycin after cultures were taken.  Incision was made in the dorsal nail fold.  There is no gross purulence in the tissues.  Incision was made on the dorsum of the middle phalanx where she was tender to palpation.  There was no gross purulence.  Incision was made at the ulnar side of the distal phalanx.  The scissors used to spread the septae and ensure complete drainage of the pad of the finger.  There is no gross purulence within the pad.  There was a track coursing from the ulnar gutter of the nail down toward the pad of the finger.  The wounds and nailbed were copiously irrigated with sterile saline.  Wounds were packed with quarter inch iodoform gauze.  A piece of Xeroform was placed in nail fold and a piece of Xeroform also used to keep open the incision under the nail fold.  The wounds were dressed with sterile 4 x 4 and wrapped with a Coban dressing lightly.  An AlumaFoam splint was placed and wrapped lightly with Coban dressing.  The tourniquet was deflated at 21 minutes.  Fingertips were pink with brisk capillary refill after deflation of tourniquet.  The operative  drapes were broken down.  The patient was awoken from anesthesia safely.  She was transferred back to the stretcher and taken to PACU in stable condition.  I will see her back in the office in 3-4 days for postoperative followup.  I  will give her a prescription for Norco 5/325 1-2 tabs PO q6 hours prn pain, dispense # 20 and Bactrim DS 1 p.o. twice daily x7 days.   Leanora Cover, MD Electronically signed, 08/23/19

## 2019-08-23 NOTE — Anesthesia Preprocedure Evaluation (Addendum)
Anesthesia Evaluation  Patient identified by MRN, date of birth, ID band Patient awake    Reviewed: Allergy & Precautions, NPO status , Patient's Chart, lab work & pertinent test results  Airway Mallampati: III  TM Distance: >3 FB Neck ROM: Full    Dental  (+) Teeth Intact, Dental Advisory Given   Pulmonary sleep apnea ,    Pulmonary exam normal breath sounds clear to auscultation       Cardiovascular hypertension, Pt. on home beta blockers and Pt. on medications + angina Normal cardiovascular exam Rhythm:Regular Rate:Normal     Neuro/Psych  Headaches, PSYCHIATRIC DISORDERS Anxiety Depression    GI/Hepatic negative GI ROS, Neg liver ROS,   Endo/Other  diabetes, Type 2, Oral Hypoglycemic AgentsMorbid obesity  Renal/GU negative Renal ROS     Musculoskeletal  (+) Arthritis , Fibromyalgia -left index finger abcess   Abdominal   Peds  Hematology  (+) Blood dyscrasia, anemia ,   Anesthesia Other Findings Day of surgery medications reviewed with the patient.  Reproductive/Obstetrics                            Anesthesia Physical Anesthesia Plan  ASA: III  Anesthesia Plan: General   Post-op Pain Management:    Induction: Intravenous  PONV Risk Score and Plan: 3 and Midazolam, Ondansetron and Propofol infusion  Airway Management Planned: Oral ETT  Additional Equipment:   Intra-op Plan:   Post-operative Plan:   Informed Consent: I have reviewed the patients History and Physical, chart, labs and discussed the procedure including the risks, benefits and alternatives for the proposed anesthesia with the patient or authorized representative who has indicated his/her understanding and acceptance.     Dental advisory given  Plan Discussed with: CRNA  Anesthesia Plan Comments:        Anesthesia Quick Evaluation

## 2019-08-24 NOTE — Anesthesia Postprocedure Evaluation (Signed)
Anesthesia Post Note  Patient: Margaret [name]  Procedure(s) Performed: IRRIGATION AND DEBRIDEMENT EXTREMITY left index finger (Left Finger)     Patient location during evaluation: PACU Anesthesia Type: General Level of consciousness: awake and alert Pain management: pain level controlled Vital Signs Assessment: post-procedure vital signs reviewed and stable Respiratory status: spontaneous breathing, nonlabored ventilation and respiratory function stable Cardiovascular status: blood pressure returned to baseline and stable Postop Assessment: no apparent nausea or vomiting Anesthetic complications: no    Last Vitals:  Vitals:   08/23/19 2210 08/23/19 2215  BP:  (!) 142/70  Pulse: (!) 105 (!) 103  Resp: 15 18  Temp:  36.7 C  SpO2: 98% 98%    Last Pain:  Vitals:   08/23/19 2200  TempSrc:   PainSc: 0-No pain                 Kline Bulthuis,W. EDMOND

## 2019-08-27 LAB — AEROBIC/ANAEROBIC CULTURE W GRAM STAIN (SURGICAL/DEEP WOUND)

## 2019-09-07 ENCOUNTER — Ambulatory Visit: Payer: Medicare Other | Admitting: Obstetrics & Gynecology

## 2019-12-08 ENCOUNTER — Other Ambulatory Visit: Payer: Self-pay | Admitting: Internal Medicine

## 2019-12-08 DIAGNOSIS — Z780 Asymptomatic menopausal state: Secondary | ICD-10-CM

## 2020-02-03 ENCOUNTER — Telehealth: Payer: Self-pay | Admitting: Family Medicine

## 2020-02-03 ENCOUNTER — Ambulatory Visit
Admission: RE | Admit: 2020-02-03 | Discharge: 2020-02-03 | Disposition: A | Discharge status: Home / Self Care | Payer: Medicare Other | Source: Ambulatory Visit | Attending: Physician Assistant | Admitting: Physician Assistant

## 2020-02-03 ENCOUNTER — Other Ambulatory Visit: Payer: Self-pay | Admitting: Physician Assistant

## 2020-02-03 DIAGNOSIS — M545 Low back pain, unspecified: Secondary | ICD-10-CM

## 2020-02-03 NOTE — Telephone Encounter (Signed)
Patient was seeing Dr Hulan Fray. She is requesting to get some medication until she can see a provider. She will be scheduled in July.

## 2020-02-07 ENCOUNTER — Telehealth: Payer: Self-pay | Admitting: *Deleted

## 2020-02-07 NOTE — Telephone Encounter (Addendum)
Received message from [facility], [place] [phone] who stated that pt is requesting refill of valacyclovir to be sent to [facility]. Per chart review, pt was given Rx on 08/10/19 w/3 additional refills. I called [facility] and was informed that pt received RX on 08/10/19, then transferred her refills to [facility] on 12/22/19. I called [facility] and they had no record of this Rx in pt's file. I called pt and left VM stating that I am calling to confirm the Rx she is requesting and the pharmacy to send it to. Pt was asked to return the call and leave a message.    6/2  1305  Rx refill sent to [facility] as requested.

## 2020-02-08 MED ORDER — VALACYCLOVIR HCL 1 G PO TABS: 1000.0000 mg | ORAL_TABLET | Freq: Two times a day (BID) | ORAL | 1 refills | Status: DC

## 2020-02-13 ENCOUNTER — Other Ambulatory Visit: Payer: Self-pay

## 2020-02-13 ENCOUNTER — Encounter: Payer: Self-pay | Admitting: *Deleted

## 2020-02-13 ENCOUNTER — Ambulatory Visit (INDEPENDENT_AMBULATORY_CARE_PROVIDER_SITE_OTHER): Payer: Medicare Other | Admitting: *Deleted

## 2020-02-13 ENCOUNTER — Other Ambulatory Visit (HOSPITAL_COMMUNITY)
Admission: RE | Admit: 2020-02-13 | Discharge: 2020-02-13 | Disposition: A | Discharge status: Home / Self Care | Payer: Medicare Other | Source: Ambulatory Visit | Attending: Family Medicine | Admitting: Family Medicine

## 2020-02-13 VITALS — Ht 64.0 in | Wt 251.8 lb

## 2020-02-13 DIAGNOSIS — N898 Other specified noninflammatory disorders of vagina: Secondary | ICD-10-CM | POA: Diagnosis present

## 2020-02-13 DIAGNOSIS — Z9189 Other specified personal risk factors, not elsewhere classified: Secondary | ICD-10-CM

## 2020-02-13 NOTE — Progress Notes (Signed)
Pt states she had white vaginal discharge, itching and irritation one month ago. She used OTC Monistat 2 weeks ago and now has clear vaginal discharge w/odor and slight itching. Pt requested testing for all STI's due to unprotected sex. Self swab obtained, blood drawn for HIV and RPR. Pt was advised she will be notified of results as well as treatment if indicated via Mychart. She voiced understanding.

## 2020-02-14 LAB — CERVICOVAGINAL ANCILLARY ONLY
Bacterial Vaginitis (gardnerella): NEGATIVE
Candida Glabrata: NEGATIVE
Candida Vaginitis: NEGATIVE
Chlamydia: NEGATIVE
Comment: NEGATIVE
Comment: NEGATIVE
Comment: NEGATIVE
Comment: NEGATIVE
Comment: NEGATIVE
Comment: NORMAL
Neisseria Gonorrhea: NEGATIVE
Trichomonas: NEGATIVE

## 2020-02-14 LAB — HIV ANTIBODY (ROUTINE TESTING W REFLEX): HIV Screen 4th Generation wRfx: NONREACTIVE

## 2020-02-14 LAB — RPR: RPR Ser Ql: NONREACTIVE

## 2020-02-14 NOTE — Progress Notes (Signed)
Chart reviewed - agree with CMA/RN documentation.  ° °

## 2020-02-20 ENCOUNTER — Other Ambulatory Visit: Payer: Self-pay | Admitting: Internal Medicine

## 2020-02-20 DIAGNOSIS — Z1231 Encounter for screening mammogram for malignant neoplasm of breast: Secondary | ICD-10-CM

## 2020-02-23 ENCOUNTER — Other Ambulatory Visit: Payer: Medicare Other

## 2020-03-07 ENCOUNTER — Encounter: Payer: Self-pay | Admitting: Family Medicine

## 2020-03-07 ENCOUNTER — Ambulatory Visit: Payer: Medicare Other | Admitting: Family Medicine

## 2020-03-07 NOTE — Progress Notes (Signed)
Patient did not keep appointment today. She may call to reschedule.  

## 2020-04-18 ENCOUNTER — Ambulatory Visit
Admission: RE | Admit: 2020-04-18 | Discharge: 2020-04-18 | Disposition: A | Discharge status: Home / Self Care | Payer: Medicare Other | Source: Ambulatory Visit | Attending: Internal Medicine | Admitting: Internal Medicine

## 2020-04-18 ENCOUNTER — Other Ambulatory Visit: Payer: Self-pay

## 2020-04-18 DIAGNOSIS — Z1231 Encounter for screening mammogram for malignant neoplasm of breast: Secondary | ICD-10-CM

## 2020-04-19 ENCOUNTER — Other Ambulatory Visit: Payer: Self-pay

## 2020-04-19 ENCOUNTER — Ambulatory Visit
Admission: RE | Admit: 2020-04-19 | Discharge: 2020-04-19 | Disposition: A | Discharge status: Home / Self Care | Payer: Medicare Other | Source: Ambulatory Visit | Attending: Internal Medicine | Admitting: Internal Medicine

## 2020-04-19 DIAGNOSIS — Z780 Asymptomatic menopausal state: Secondary | ICD-10-CM

## 2020-04-20 ENCOUNTER — Encounter: Payer: Self-pay | Admitting: Obstetrics & Gynecology

## 2020-04-20 ENCOUNTER — Other Ambulatory Visit (HOSPITAL_COMMUNITY)
Admission: RE | Admit: 2020-04-20 | Discharge: 2020-04-20 | Disposition: A | Discharge status: Home / Self Care | Payer: Medicare Other | Source: Ambulatory Visit | Attending: Family Medicine | Admitting: Family Medicine

## 2020-04-20 ENCOUNTER — Ambulatory Visit (INDEPENDENT_AMBULATORY_CARE_PROVIDER_SITE_OTHER): Payer: Medicare Other | Admitting: Obstetrics & Gynecology

## 2020-04-20 DIAGNOSIS — N898 Other specified noninflammatory disorders of vagina: Secondary | ICD-10-CM | POA: Insufficient documentation

## 2020-04-20 NOTE — Patient Instructions (Signed)
Vaginitis Vaginitis is a condition in which the vaginal tissue swells and becomes red (inflamed). This condition is most often caused by a change in the normal balance of bacteria and yeast that live in the vagina. This change causes an overgrowth of certain bacteria or yeast, which causes the inflammation. There are different types of vaginitis, but the most common types are:  Bacterial vaginosis.  Yeast infection (candidiasis).  Trichomoniasis vaginitis. This is a sexually transmitted disease (STD).  Viral vaginitis.  Atrophic vaginitis.  Allergic vaginitis. What are the causes? The cause of this condition depends on the type of vaginitis. It can be caused by:  Bacteria (bacterial vaginosis).  Yeast, which is a fungus (yeast infection).  A parasite (trichomoniasis vaginitis).  A virus (viral vaginitis).  Low hormone levels (atrophic vaginitis). Low hormone levels can occur during pregnancy, breastfeeding, or after menopause.  Irritants, such as bubble baths, scented tampons, and feminine sprays (allergic vaginitis). Other factors can change the normal balance of the yeast and bacteria that live in the vagina. These include:  Antibiotic medicines.  Poor hygiene.  Diaphragms, vaginal sponges, spermicides, birth control pills, and intrauterine devices (IUD).  Sex.  Infection.  Uncontrolled diabetes.  A weakened defense (immune) system. What increases the risk? This condition is more likely to develop in women who:  Smoke.  Use vaginal douches, scented tampons, or scented sanitary pads.  Wear tight-fitting pants.  Wear thong underwear.  Use oral birth control pills or an IUD.  Have sex without a condom.  Have multiple sex partners.  Have an STD.  Frequently use the spermicide nonoxynol-9.  Eat lots of foods high in sugar.  Have uncontrolled diabetes.  Have low estrogen levels.  Have a weakened immune system from an immune disorder or medical  treatment.  Are pregnant or breastfeeding. What are the signs or symptoms? Symptoms vary depending on the cause of the vaginitis. Common symptoms include:  Abnormal vaginal discharge. ? The discharge is white, gray, or yellow with bacterial vaginosis. ? The discharge is thick, white, and cheesy with a yeast infection. ? The discharge is frothy and yellow or greenish with trichomoniasis.  A bad vaginal smell. The smell is fishy with bacterial vaginosis.  Vaginal itching, pain, or swelling.  Sex that is painful.  Pain or burning when urinating. Sometimes there are no symptoms. How is this diagnosed? This condition is diagnosed based on your symptoms and medical history. A physical exam, including a pelvic exam, will also be done. You may also have other tests, including:  Tests to determine the pH level (acidity or alkalinity) of your vagina.  A whiff test, to assess the odor that results when a sample of your vaginal discharge is mixed with a potassium hydroxide solution.  Tests of vaginal fluid. A sample will be examined under a microscope. How is this treated? Treatment varies depending on the type of vaginitis you have. Your treatment may include:  Antibiotic creams or pills to treat bacterial vaginosis and trichomoniasis.  Antifungal medicines, such as vaginal creams or suppositories, to treat a yeast infection.  Medicine to ease discomfort if you have viral vaginitis. Your sexual partner should also be treated.  Estrogen delivered in a cream, pill, suppository, or vaginal ring to treat atrophic vaginitis. If vaginal dryness occurs, lubricants and moisturizing creams may help. You may need to avoid scented soaps, sprays, or douches.  Stopping use of a product that is causing allergic vaginitis. Then using a vaginal cream to treat the symptoms. Follow   these instructions at home: Lifestyle  Keep your genital area clean and dry. Avoid soap, and only rinse the area with  water.  Do not douche or use tampons until your health care provider says it is okay to do so. Use sanitary pads, if needed.  Do not have sex until your health care provider approves. When you can return to sex, practice safe sex and use condoms.  Wipe from front to back. This avoids the spread of bacteria from the rectum to the vagina. General instructions  Take over-the-counter and prescription medicines only as told by your health care provider.  If you were prescribed an antibiotic medicine, take or use it as told by your health care provider. Do not stop taking or using the antibiotic even if you start to feel better.  Keep all follow-up visits as told by your health care provider. This is important. How is this prevented?  Use mild, non-scented products. Do not use things that can irritate the vagina, such as fabric softeners. Avoid the following products if they are scented: ? Feminine sprays. ? Detergents. ? Tampons. ? Feminine hygiene products. ? Soaps or bubble baths.  Let air reach your genital area. ? Wear cotton underwear to reduce moisture buildup. ? Avoid wearing underwear while you sleep. ? Avoid wearing tight pants and underwear or nylons without a cotton panel. ? Avoid wearing thong underwear.  Take off any wet clothing, such as bathing suits, as soon as possible.  Practice safe sex and use condoms. Contact a health care provider if:  You have abdominal pain.  You have a fever.  You have symptoms that last for more than 2-3 days. Get help right away if:  You have a fever and your symptoms suddenly get worse. Summary  Vaginitis is a condition in which the vaginal tissue becomes inflamed.This condition is most often caused by a change in the normal balance of bacteria and yeast that live in the vagina.  Treatment varies depending on the type of vaginitis you have.  Do not douche, use tampons , or have sex until your health care provider approves. When  you can return to sex, practice safe sex and use condoms. This information is not intended to replace advice given to you by your health care provider. Make sure you discuss any questions you have with your health care provider. Document Revised: 08/07/2017 Document Reviewed: 09/30/2016 Elsevier Patient Education  2020 Elsevier Inc.  

## 2020-04-20 NOTE — Progress Notes (Signed)
GYNECOLOGY OFFICE VISIT NOTE  History:   Margaret [name] is a 59 y.o. [phone] here today for evaluation of persistent vaginal odor for two months. Happens when she sweats a lot.  Had negative self-swab in 02/2020.  No noted abnormal discharge; she denies any abnormal vaginal bleeding, pelvic pain or other concerns.  History of TAH in 2013 for benign indications.    Past Medical History:  Diagnosis Date   Allergy    Anemia    Anxiety    Arthritis    knees/elbows   Chronic back pain    Depression    Diabetes mellitus without complication ([place])    type 2   Fibroids    Fibromyalgia    History of blood product transfusion    HSV infection    Hyperlipidemia    Hypertension    Menorrhagia    Migraines    not on medication   Morbid obesity ([place])    Right carpal tunnel syndrome    Sleep apnea    does not use cpap    Past Surgical History:  Procedure Laterality Date   ABDOMINAL HYSTERECTOMY  07/14/2012   Procedure: HYSTERECTOMY ABDOMINAL;  Surgeon: Emily Filbert, MD;  Location: [facility];  Service: Gynecology;  Laterality: N/A;   CESAREAN SECTION     x2   COLONOSCOPY     normal   endomerial ablation     ENDOMETRIAL ABLATION     I & D EXTREMITY Left 08/23/2019   Procedure: IRRIGATION AND DEBRIDEMENT EXTREMITY left index finger;  Surgeon: Leanora Cover, MD;  Location: [facility];  Service: Orthopedics;  Laterality: Left;   LEFT HEART CATH AND CORONARY ANGIOGRAPHY N/A 06/11/2017   Procedure: LEFT HEART CATH AND CORONARY ANGIOGRAPHY;  Surgeon: Charolette Forward, MD;  Location: [facility];  Service: Cardiovascular;  Laterality: N/A;   NECK SURGERY     twice   ROBOTIC ASSISTED LAPAROSCOPIC LYSIS OF ADHESION  07/14/2012   Procedure: ROBOTIC ASSISTED LAPAROSCOPIC LYSIS OF ADHESION;  Surgeon: Emily Filbert, MD;  Location: [facility];  Service: Gynecology;  Laterality: N/A;   TUBAL LIGATION     WISDOM TOOTH EXTRACTION      The following portions of the patient's  history were reviewed and updated as appropriate: allergies, current medications, past family history, past medical history, past social history, past surgical history and problem list.   Health Maintenance:  Normal mammogram on 04/18/2020.   Review of Systems:  Pertinent items noted in HPI and remainder of comprehensive ROS otherwise negative.  Physical Exam:  LMP 03/30/2011  CONSTITUTIONAL: Well-developed, well-nourished female in no acute distress.  NEUROLOGIC: Alert and oriented to person, place, and time. Normal muscle tone coordination. No cranial nerve deficit noted. PSYCHIATRIC: Normal mood and affect. Normal behavior. Normal judgment and thought content. CARDIOVASCULAR: Normal heart rate noted RESPIRATORY: Effort and breath sounds normal, no problems with respiration noted ABDOMEN: No masses noted. No other overt distention noted.   PELVIC: Normal appearing external genitalia; normal urethral meatus; normal appearing vaginal mucosa and cuff.  Thin white discharge noted, testing sample obtained. Performed in the presence of a chaperone      Assessment and Plan:      1. Vaginal odor Will check test and manage accordingly - Cervicovaginal ancillary only( [place]) Likely secondary to sweat, patient advised about proper vulvovaginal hygiene. Discussed avoidance of perfumed soaps, detergents, lotions and any type of douches; in addition to wearing cotton underwear and no underwear at night.  Also  recommended cleaning front to back, voiding and cleaning up after intercourse.   If she does have vaginitis and it is recurrent, may need prolonged therapy/boric acid vaginal regimen.   Routine preventative health maintenance measures emphasized. Please refer to After Visit Summary for other counseling recommendations.   Return for any gynecologic concerns.    Total face-to-face time with patient: 15 minutes.  Over 50% of encounter was spent on counseling and coordination of  care.   Verita Schneiders, MD, [place] for [employer], [place]

## 2020-04-23 LAB — CERVICOVAGINAL ANCILLARY ONLY
Bacterial Vaginitis (gardnerella): NEGATIVE
Candida Glabrata: NEGATIVE
Candida Vaginitis: NEGATIVE
Chlamydia: NEGATIVE
Comment: NEGATIVE
Comment: NEGATIVE
Comment: NEGATIVE
Comment: NEGATIVE
Comment: NEGATIVE
Comment: NORMAL
Neisseria Gonorrhea: NEGATIVE
Trichomonas: NEGATIVE

## 2020-09-18 DIAGNOSIS — Z1152 Encounter for screening for COVID-19: Secondary | ICD-10-CM | POA: Diagnosis not present

## 2020-09-21 ENCOUNTER — Telehealth: Payer: Self-pay | Admitting: *Deleted

## 2020-09-21 MED ORDER — VALACYCLOVIR HCL 500 MG PO TABS: 1000.0000 mg | ORAL_TABLET | Freq: Every day | ORAL | 6 refills | Status: DC

## 2020-09-21 NOTE — Telephone Encounter (Signed)
Message left by staff member @ [facility] MD office stating that pt needs refill Rx for Valacyclovir 1000 mg - 1 tab twice daily x10 days, #20. I reviewed the request with Dr. Harolyn Rutherford who approved and recommended that pt consider taking suppression dose of this medication. I called pt and after a very lengthy discussion, it was determined that pt does not currently have sx of herpes outbreak. She reported that she is under a lot of personal stress and had concerns for outbreak to occur. I discussed the suppression dose of medication and she agreed. Per chart review, pt has not had Annual Gyn exam for >3 years and this was discussed. She will be scheduled for appointment in a month or so. Pt voiced understanding of all information and instructions given.

## 2020-10-01 DIAGNOSIS — G43109 Migraine with aura, not intractable, without status migrainosus: Secondary | ICD-10-CM | POA: Diagnosis not present

## 2020-10-01 DIAGNOSIS — E1159 Type 2 diabetes mellitus with other circulatory complications: Secondary | ICD-10-CM | POA: Diagnosis not present

## 2020-10-01 DIAGNOSIS — I10 Essential (primary) hypertension: Secondary | ICD-10-CM | POA: Diagnosis not present

## 2020-10-01 DIAGNOSIS — E1169 Type 2 diabetes mellitus with other specified complication: Secondary | ICD-10-CM | POA: Diagnosis not present

## 2020-10-01 DIAGNOSIS — E785 Hyperlipidemia, unspecified: Secondary | ICD-10-CM | POA: Diagnosis not present

## 2020-10-15 ENCOUNTER — Ambulatory Visit: Payer: Medicare Other | Admitting: Obstetrics & Gynecology

## 2020-10-23 DIAGNOSIS — R6889 Other general symptoms and signs: Secondary | ICD-10-CM | POA: Diagnosis not present

## 2020-10-30 DIAGNOSIS — E1169 Type 2 diabetes mellitus with other specified complication: Secondary | ICD-10-CM | POA: Diagnosis not present

## 2020-10-30 DIAGNOSIS — G43109 Migraine with aura, not intractable, without status migrainosus: Secondary | ICD-10-CM | POA: Diagnosis not present

## 2020-10-30 DIAGNOSIS — E785 Hyperlipidemia, unspecified: Secondary | ICD-10-CM | POA: Diagnosis not present

## 2020-10-30 DIAGNOSIS — I10 Essential (primary) hypertension: Secondary | ICD-10-CM | POA: Diagnosis not present

## 2020-10-30 DIAGNOSIS — E1159 Type 2 diabetes mellitus with other circulatory complications: Secondary | ICD-10-CM | POA: Diagnosis not present

## 2020-10-31 DIAGNOSIS — R6889 Other general symptoms and signs: Secondary | ICD-10-CM | POA: Diagnosis not present

## 2020-10-31 DIAGNOSIS — M542 Cervicalgia: Secondary | ICD-10-CM | POA: Diagnosis not present

## 2020-10-31 DIAGNOSIS — Z79899 Other long term (current) drug therapy: Secondary | ICD-10-CM | POA: Diagnosis not present

## 2020-10-31 DIAGNOSIS — M25521 Pain in right elbow: Secondary | ICD-10-CM | POA: Diagnosis not present

## 2020-10-31 DIAGNOSIS — G43709 Chronic migraine without aura, not intractable, without status migrainosus: Secondary | ICD-10-CM | POA: Diagnosis not present

## 2020-10-31 DIAGNOSIS — G5603 Carpal tunnel syndrome, bilateral upper limbs: Secondary | ICD-10-CM | POA: Diagnosis not present

## 2020-10-31 DIAGNOSIS — G894 Chronic pain syndrome: Secondary | ICD-10-CM | POA: Diagnosis not present

## 2020-10-31 DIAGNOSIS — G4440 Drug-induced headache, not elsewhere classified, not intractable: Secondary | ICD-10-CM | POA: Diagnosis not present

## 2020-10-31 DIAGNOSIS — M797 Fibromyalgia: Secondary | ICD-10-CM | POA: Diagnosis not present

## 2020-10-31 DIAGNOSIS — G8929 Other chronic pain: Secondary | ICD-10-CM | POA: Diagnosis not present

## 2020-10-31 DIAGNOSIS — M545 Low back pain, unspecified: Secondary | ICD-10-CM | POA: Diagnosis not present

## 2020-11-12 DIAGNOSIS — R6889 Other general symptoms and signs: Secondary | ICD-10-CM | POA: Diagnosis not present

## 2020-11-12 DIAGNOSIS — H40022 Open angle with borderline findings, high risk, left eye: Secondary | ICD-10-CM | POA: Diagnosis not present

## 2020-11-26 DIAGNOSIS — R6889 Other general symptoms and signs: Secondary | ICD-10-CM | POA: Diagnosis not present

## 2020-11-27 DIAGNOSIS — R6889 Other general symptoms and signs: Secondary | ICD-10-CM | POA: Diagnosis not present

## 2020-12-06 DIAGNOSIS — G43109 Migraine with aura, not intractable, without status migrainosus: Secondary | ICD-10-CM | POA: Diagnosis not present

## 2020-12-06 DIAGNOSIS — I10 Essential (primary) hypertension: Secondary | ICD-10-CM | POA: Diagnosis not present

## 2020-12-06 DIAGNOSIS — E785 Hyperlipidemia, unspecified: Secondary | ICD-10-CM | POA: Diagnosis not present

## 2020-12-06 DIAGNOSIS — E1169 Type 2 diabetes mellitus with other specified complication: Secondary | ICD-10-CM | POA: Diagnosis not present

## 2020-12-06 DIAGNOSIS — E1159 Type 2 diabetes mellitus with other circulatory complications: Secondary | ICD-10-CM | POA: Diagnosis not present

## 2020-12-07 DIAGNOSIS — R6889 Other general symptoms and signs: Secondary | ICD-10-CM | POA: Diagnosis not present

## 2020-12-07 DIAGNOSIS — M5417 Radiculopathy, lumbosacral region: Secondary | ICD-10-CM | POA: Diagnosis not present

## 2020-12-07 DIAGNOSIS — G894 Chronic pain syndrome: Secondary | ICD-10-CM | POA: Diagnosis not present

## 2020-12-20 DIAGNOSIS — E1159 Type 2 diabetes mellitus with other circulatory complications: Secondary | ICD-10-CM | POA: Diagnosis not present

## 2020-12-20 DIAGNOSIS — I10 Essential (primary) hypertension: Secondary | ICD-10-CM | POA: Diagnosis not present

## 2020-12-20 DIAGNOSIS — E785 Hyperlipidemia, unspecified: Secondary | ICD-10-CM | POA: Diagnosis not present

## 2020-12-20 DIAGNOSIS — G43109 Migraine with aura, not intractable, without status migrainosus: Secondary | ICD-10-CM | POA: Diagnosis not present

## 2020-12-20 DIAGNOSIS — E1169 Type 2 diabetes mellitus with other specified complication: Secondary | ICD-10-CM | POA: Diagnosis not present

## 2020-12-25 DIAGNOSIS — Z5181 Encounter for therapeutic drug level monitoring: Secondary | ICD-10-CM | POA: Diagnosis not present

## 2020-12-25 DIAGNOSIS — Z79899 Other long term (current) drug therapy: Secondary | ICD-10-CM | POA: Diagnosis not present

## 2020-12-26 DIAGNOSIS — M542 Cervicalgia: Secondary | ICD-10-CM | POA: Diagnosis not present

## 2020-12-26 DIAGNOSIS — G4440 Drug-induced headache, not elsewhere classified, not intractable: Secondary | ICD-10-CM | POA: Diagnosis not present

## 2020-12-26 DIAGNOSIS — G43709 Chronic migraine without aura, not intractable, without status migrainosus: Secondary | ICD-10-CM | POA: Diagnosis not present

## 2020-12-26 DIAGNOSIS — G894 Chronic pain syndrome: Secondary | ICD-10-CM | POA: Diagnosis not present

## 2020-12-26 DIAGNOSIS — G5603 Carpal tunnel syndrome, bilateral upper limbs: Secondary | ICD-10-CM | POA: Diagnosis not present

## 2020-12-26 DIAGNOSIS — G8929 Other chronic pain: Secondary | ICD-10-CM | POA: Diagnosis not present

## 2020-12-26 DIAGNOSIS — M5417 Radiculopathy, lumbosacral region: Secondary | ICD-10-CM | POA: Diagnosis not present

## 2020-12-26 DIAGNOSIS — M545 Low back pain, unspecified: Secondary | ICD-10-CM | POA: Diagnosis not present

## 2020-12-26 DIAGNOSIS — M797 Fibromyalgia: Secondary | ICD-10-CM | POA: Diagnosis not present

## 2020-12-26 DIAGNOSIS — M25521 Pain in right elbow: Secondary | ICD-10-CM | POA: Diagnosis not present

## 2021-01-03 DIAGNOSIS — E119 Type 2 diabetes mellitus without complications: Secondary | ICD-10-CM | POA: Diagnosis not present

## 2021-01-03 DIAGNOSIS — I25118 Atherosclerotic heart disease of native coronary artery with other forms of angina pectoris: Secondary | ICD-10-CM | POA: Diagnosis not present

## 2021-01-03 DIAGNOSIS — M797 Fibromyalgia: Secondary | ICD-10-CM | POA: Diagnosis not present

## 2021-01-03 DIAGNOSIS — I5030 Unspecified diastolic (congestive) heart failure: Secondary | ICD-10-CM | POA: Diagnosis not present

## 2021-01-03 DIAGNOSIS — I10 Essential (primary) hypertension: Secondary | ICD-10-CM | POA: Diagnosis not present

## 2021-01-03 DIAGNOSIS — E785 Hyperlipidemia, unspecified: Secondary | ICD-10-CM | POA: Diagnosis not present

## 2021-01-18 DIAGNOSIS — I10 Essential (primary) hypertension: Secondary | ICD-10-CM | POA: Diagnosis not present

## 2021-01-18 DIAGNOSIS — G43109 Migraine with aura, not intractable, without status migrainosus: Secondary | ICD-10-CM | POA: Diagnosis not present

## 2021-01-18 DIAGNOSIS — E785 Hyperlipidemia, unspecified: Secondary | ICD-10-CM | POA: Diagnosis not present

## 2021-01-18 DIAGNOSIS — E1159 Type 2 diabetes mellitus with other circulatory complications: Secondary | ICD-10-CM | POA: Diagnosis not present

## 2021-01-18 DIAGNOSIS — E1169 Type 2 diabetes mellitus with other specified complication: Secondary | ICD-10-CM | POA: Diagnosis not present

## 2021-01-24 DIAGNOSIS — Z0000 Encounter for general adult medical examination without abnormal findings: Secondary | ICD-10-CM | POA: Diagnosis not present

## 2021-01-24 DIAGNOSIS — E785 Hyperlipidemia, unspecified: Secondary | ICD-10-CM | POA: Diagnosis not present

## 2021-01-24 DIAGNOSIS — G894 Chronic pain syndrome: Secondary | ICD-10-CM | POA: Diagnosis not present

## 2021-01-24 DIAGNOSIS — E1165 Type 2 diabetes mellitus with hyperglycemia: Secondary | ICD-10-CM | POA: Diagnosis not present

## 2021-01-24 DIAGNOSIS — I10 Essential (primary) hypertension: Secondary | ICD-10-CM | POA: Diagnosis not present

## 2021-01-24 DIAGNOSIS — E1159 Type 2 diabetes mellitus with other circulatory complications: Secondary | ICD-10-CM | POA: Diagnosis not present

## 2021-01-28 DIAGNOSIS — H40022 Open angle with borderline findings, high risk, left eye: Secondary | ICD-10-CM | POA: Diagnosis not present

## 2021-01-30 DIAGNOSIS — G4733 Obstructive sleep apnea (adult) (pediatric): Secondary | ICD-10-CM | POA: Diagnosis not present

## 2021-02-05 DIAGNOSIS — Z91048 Other nonmedicinal substance allergy status: Secondary | ICD-10-CM | POA: Diagnosis not present

## 2021-02-05 DIAGNOSIS — G894 Chronic pain syndrome: Secondary | ICD-10-CM | POA: Diagnosis not present

## 2021-02-05 DIAGNOSIS — Z888 Allergy status to other drugs, medicaments and biological substances status: Secondary | ICD-10-CM | POA: Diagnosis not present

## 2021-02-05 DIAGNOSIS — M5417 Radiculopathy, lumbosacral region: Secondary | ICD-10-CM | POA: Diagnosis not present

## 2021-02-05 DIAGNOSIS — I10 Essential (primary) hypertension: Secondary | ICD-10-CM | POA: Diagnosis not present

## 2021-02-05 DIAGNOSIS — E119 Type 2 diabetes mellitus without complications: Secondary | ICD-10-CM | POA: Diagnosis not present

## 2021-02-05 DIAGNOSIS — Z886 Allergy status to analgesic agent status: Secondary | ICD-10-CM | POA: Diagnosis not present

## 2021-02-05 DIAGNOSIS — Z880 Allergy status to penicillin: Secondary | ICD-10-CM | POA: Diagnosis not present

## 2021-02-05 DIAGNOSIS — R531 Weakness: Secondary | ICD-10-CM | POA: Diagnosis not present

## 2021-02-15 DIAGNOSIS — R6889 Other general symptoms and signs: Secondary | ICD-10-CM | POA: Diagnosis not present

## 2021-02-18 DIAGNOSIS — I10 Essential (primary) hypertension: Secondary | ICD-10-CM | POA: Diagnosis not present

## 2021-02-18 DIAGNOSIS — E1169 Type 2 diabetes mellitus with other specified complication: Secondary | ICD-10-CM | POA: Diagnosis not present

## 2021-02-18 DIAGNOSIS — E1159 Type 2 diabetes mellitus with other circulatory complications: Secondary | ICD-10-CM | POA: Diagnosis not present

## 2021-02-18 DIAGNOSIS — G43109 Migraine with aura, not intractable, without status migrainosus: Secondary | ICD-10-CM | POA: Diagnosis not present

## 2021-02-18 DIAGNOSIS — E785 Hyperlipidemia, unspecified: Secondary | ICD-10-CM | POA: Diagnosis not present

## 2021-03-13 DIAGNOSIS — G43709 Chronic migraine without aura, not intractable, without status migrainosus: Secondary | ICD-10-CM | POA: Diagnosis not present

## 2021-03-15 DIAGNOSIS — G4752 REM sleep behavior disorder: Secondary | ICD-10-CM | POA: Diagnosis not present

## 2021-03-15 DIAGNOSIS — E1159 Type 2 diabetes mellitus with other circulatory complications: Secondary | ICD-10-CM | POA: Diagnosis not present

## 2021-03-15 DIAGNOSIS — I10 Essential (primary) hypertension: Secondary | ICD-10-CM | POA: Diagnosis not present

## 2021-03-18 ENCOUNTER — Other Ambulatory Visit (HOSPITAL_BASED_OUTPATIENT_CLINIC_OR_DEPARTMENT_OTHER): Payer: Self-pay

## 2021-03-18 DIAGNOSIS — R0683 Snoring: Secondary | ICD-10-CM

## 2021-03-18 DIAGNOSIS — G4752 REM sleep behavior disorder: Secondary | ICD-10-CM

## 2021-03-18 DIAGNOSIS — G4719 Other hypersomnia: Secondary | ICD-10-CM

## 2021-03-19 DIAGNOSIS — G43109 Migraine with aura, not intractable, without status migrainosus: Secondary | ICD-10-CM | POA: Diagnosis not present

## 2021-03-19 DIAGNOSIS — E785 Hyperlipidemia, unspecified: Secondary | ICD-10-CM | POA: Diagnosis not present

## 2021-03-19 DIAGNOSIS — I10 Essential (primary) hypertension: Secondary | ICD-10-CM | POA: Diagnosis not present

## 2021-03-19 DIAGNOSIS — E1169 Type 2 diabetes mellitus with other specified complication: Secondary | ICD-10-CM | POA: Diagnosis not present

## 2021-03-19 DIAGNOSIS — E1159 Type 2 diabetes mellitus with other circulatory complications: Secondary | ICD-10-CM | POA: Diagnosis not present

## 2021-03-25 ENCOUNTER — Other Ambulatory Visit: Payer: Self-pay | Admitting: Internal Medicine

## 2021-03-25 DIAGNOSIS — Z1231 Encounter for screening mammogram for malignant neoplasm of breast: Secondary | ICD-10-CM

## 2021-04-10 DIAGNOSIS — E785 Hyperlipidemia, unspecified: Secondary | ICD-10-CM | POA: Diagnosis not present

## 2021-04-10 DIAGNOSIS — I25118 Atherosclerotic heart disease of native coronary artery with other forms of angina pectoris: Secondary | ICD-10-CM | POA: Diagnosis not present

## 2021-04-10 DIAGNOSIS — I10 Essential (primary) hypertension: Secondary | ICD-10-CM | POA: Diagnosis not present

## 2021-04-10 DIAGNOSIS — E119 Type 2 diabetes mellitus without complications: Secondary | ICD-10-CM | POA: Diagnosis not present

## 2021-04-10 DIAGNOSIS — I5030 Unspecified diastolic (congestive) heart failure: Secondary | ICD-10-CM | POA: Diagnosis not present

## 2021-04-22 DIAGNOSIS — I10 Essential (primary) hypertension: Secondary | ICD-10-CM | POA: Diagnosis not present

## 2021-04-22 DIAGNOSIS — E1159 Type 2 diabetes mellitus with other circulatory complications: Secondary | ICD-10-CM | POA: Diagnosis not present

## 2021-04-22 DIAGNOSIS — G43109 Migraine with aura, not intractable, without status migrainosus: Secondary | ICD-10-CM | POA: Diagnosis not present

## 2021-04-22 DIAGNOSIS — E785 Hyperlipidemia, unspecified: Secondary | ICD-10-CM | POA: Diagnosis not present

## 2021-04-22 DIAGNOSIS — E1169 Type 2 diabetes mellitus with other specified complication: Secondary | ICD-10-CM | POA: Diagnosis not present

## 2021-05-08 DIAGNOSIS — G8929 Other chronic pain: Secondary | ICD-10-CM | POA: Diagnosis not present

## 2021-05-08 DIAGNOSIS — M5417 Radiculopathy, lumbosacral region: Secondary | ICD-10-CM | POA: Diagnosis not present

## 2021-05-08 DIAGNOSIS — G4440 Drug-induced headache, not elsewhere classified, not intractable: Secondary | ICD-10-CM | POA: Diagnosis not present

## 2021-05-08 DIAGNOSIS — M797 Fibromyalgia: Secondary | ICD-10-CM | POA: Diagnosis not present

## 2021-05-08 DIAGNOSIS — M545 Low back pain, unspecified: Secondary | ICD-10-CM | POA: Diagnosis not present

## 2021-05-08 DIAGNOSIS — M542 Cervicalgia: Secondary | ICD-10-CM | POA: Diagnosis not present

## 2021-05-08 DIAGNOSIS — G43709 Chronic migraine without aura, not intractable, without status migrainosus: Secondary | ICD-10-CM | POA: Diagnosis not present

## 2021-05-08 DIAGNOSIS — G894 Chronic pain syndrome: Secondary | ICD-10-CM | POA: Diagnosis not present

## 2021-05-08 DIAGNOSIS — G5603 Carpal tunnel syndrome, bilateral upper limbs: Secondary | ICD-10-CM | POA: Diagnosis not present

## 2021-05-08 DIAGNOSIS — M25521 Pain in right elbow: Secondary | ICD-10-CM | POA: Diagnosis not present

## 2021-05-17 ENCOUNTER — Other Ambulatory Visit: Payer: Self-pay

## 2021-05-17 ENCOUNTER — Ambulatory Visit
Admission: RE | Admit: 2021-05-17 | Discharge: 2021-05-17 | Disposition: A | Discharge status: Home / Self Care | Payer: Medicare Other | Source: Ambulatory Visit | Attending: Internal Medicine | Admitting: Internal Medicine

## 2021-05-17 DIAGNOSIS — Z1231 Encounter for screening mammogram for malignant neoplasm of breast: Secondary | ICD-10-CM | POA: Diagnosis not present

## 2021-05-17 DIAGNOSIS — E785 Hyperlipidemia, unspecified: Secondary | ICD-10-CM | POA: Diagnosis not present

## 2021-05-17 DIAGNOSIS — E1169 Type 2 diabetes mellitus with other specified complication: Secondary | ICD-10-CM | POA: Diagnosis not present

## 2021-05-17 DIAGNOSIS — E1159 Type 2 diabetes mellitus with other circulatory complications: Secondary | ICD-10-CM | POA: Diagnosis not present

## 2021-05-17 DIAGNOSIS — G43109 Migraine with aura, not intractable, without status migrainosus: Secondary | ICD-10-CM | POA: Diagnosis not present

## 2021-05-17 DIAGNOSIS — I10 Essential (primary) hypertension: Secondary | ICD-10-CM | POA: Diagnosis not present

## 2021-05-20 DIAGNOSIS — H40022 Open angle with borderline findings, high risk, left eye: Secondary | ICD-10-CM | POA: Diagnosis not present

## 2021-05-28 DIAGNOSIS — H40012 Open angle with borderline findings, low risk, left eye: Secondary | ICD-10-CM | POA: Diagnosis not present

## 2021-05-29 ENCOUNTER — Other Ambulatory Visit: Payer: Self-pay

## 2021-05-29 ENCOUNTER — Ambulatory Visit (HOSPITAL_BASED_OUTPATIENT_CLINIC_OR_DEPARTMENT_OTHER): Payer: Medicare Other | Attending: Sleep Medicine | Admitting: Sleep Medicine

## 2021-05-29 DIAGNOSIS — R0683 Snoring: Secondary | ICD-10-CM | POA: Diagnosis present

## 2021-05-29 DIAGNOSIS — G4719 Other hypersomnia: Secondary | ICD-10-CM | POA: Diagnosis not present

## 2021-05-29 DIAGNOSIS — G4752 REM sleep behavior disorder: Secondary | ICD-10-CM

## 2021-05-29 DIAGNOSIS — G4733 Obstructive sleep apnea (adult) (pediatric): Secondary | ICD-10-CM | POA: Diagnosis not present

## 2021-06-17 DIAGNOSIS — G4733 Obstructive sleep apnea (adult) (pediatric): Secondary | ICD-10-CM | POA: Diagnosis not present

## 2021-06-17 NOTE — Procedures (Signed)
   NAME: Margaret [name] DATE OF BIRTH:  1961/03/29 MEDICAL RECORD NUMBER [id number]  LOCATION: [facility]  PHYSICIAN:  D   DATE OF STUDY: 05/29/2021  SLEEP STUDY TYPE: Nocturnal Polysomnogram               REFERRING PHYSICIAN: Elmarie Mainland, MD  CLINICAL INFORMATION [name] is a 60 year old Female and was referred to the sleep center for evaluation of OSA.  MEDICATIONS Patient self administered medications include: Metoprolol succinate, Simvastatin. No sleep medicine administered.  SLEEP STUDY TECHNIQUE A multi-channel overnight Polysomnography study was performed. The channels recorded and monitored were central and occipital EEG, electrooculogram (EOG), submentalis EMG (chin), nasal and oral airflow, thoracic and abdominal wall motion, anterior tibialis EMG, snore microphone, electrocardiogram, and a pulse oximetry. SLEEP ARCHITECTURE The study was initiated at 11:14:06 PM and terminated at 5:22:12 AM. The total recorded time was 368.1 minutes. EEG confirmed total sleep time was 319 minutes yielding a sleep efficiency of 86.7%. Sleep onset after lights out was 18.4 minutes with a REM latency of 130.0 minutes. The patient spent 12.4% of the night in stage N1 sleep, 61.0% in stage N2 sleep, 0.0% in stage N3 and 26.7% in REM. Wake after sleep onset (WASO) was 30.7 minutes. The Arousal Index was 16.0/hour. RESPIRATORY PARAMETERS There were a total of 114 respiratory disturbances out of which 43 were apneas (39 obstructive, 2 mixed, 2 central) and 71 hypopneas. The apnea/hypopnea index (AHI) was 21.4 events/hour. The central sleep apnea index was 0.4 events/hour. The REM AHI was 58.6 events/hour and NREM AHI was 7.9 events/hour. The supine AHI was 21.4 events/hour and the non supine AHI was 0 supine during 100.00% of sleep. Respiratory disturbance index was 23.7, associated with oxygen desaturation down to a nadir of 74.0% during sleep. The mean oxygen  saturation during the study was 95.4%.  LEG MOVEMENT DATA The total leg movements were 8 with a resulting leg movement index of 1.5/hr. Associated arousal with leg movement index was 0.0/hr.   CARDIAC DATA The underlying cardiac rhythm was most consistent with sinus rhythm. Mean heart rate during sleep was 78.0 bpm. Additional rhythm abnormalities include PVCs. IMPRESSIONS - Moderate Obstructive Sleep apnea(OSA) DIAGNOSIS - Obstructive Sleep Apnea (G47.33) RECOMMENDATIONS - Therapeutic CPAP titration to determine optimal pressure required to alleviate sleep disordered breathing. - Avoid alcohol, sedatives and other CNS depressants that may worsen sleep apnea and disrupt normal sleep architecture. - Sleep hygiene should be reviewed to assess factors that may improve sleep quality. - Weight management and regular exercise should be initiated or continued.    D  Diplomate, American Board of Internal Medicine  ELECTRONICALLY SIGNED ON:  06/17/2021, 2:17 PM [place] PH: [phone]   FX: [phone] [place]

## 2021-07-08 DIAGNOSIS — E1159 Type 2 diabetes mellitus with other circulatory complications: Secondary | ICD-10-CM | POA: Diagnosis not present

## 2021-07-08 DIAGNOSIS — I10 Essential (primary) hypertension: Secondary | ICD-10-CM | POA: Diagnosis not present

## 2021-07-08 DIAGNOSIS — E785 Hyperlipidemia, unspecified: Secondary | ICD-10-CM | POA: Diagnosis not present

## 2021-07-08 DIAGNOSIS — E1169 Type 2 diabetes mellitus with other specified complication: Secondary | ICD-10-CM | POA: Diagnosis not present

## 2021-07-08 DIAGNOSIS — G43109 Migraine with aura, not intractable, without status migrainosus: Secondary | ICD-10-CM | POA: Diagnosis not present

## 2021-07-09 DIAGNOSIS — M7061 Trochanteric bursitis, right hip: Secondary | ICD-10-CM | POA: Diagnosis not present

## 2021-07-09 DIAGNOSIS — M25521 Pain in right elbow: Secondary | ICD-10-CM | POA: Diagnosis not present

## 2021-07-09 DIAGNOSIS — M797 Fibromyalgia: Secondary | ICD-10-CM | POA: Diagnosis not present

## 2021-07-09 DIAGNOSIS — Z79899 Other long term (current) drug therapy: Secondary | ICD-10-CM | POA: Diagnosis not present

## 2021-07-09 DIAGNOSIS — G43709 Chronic migraine without aura, not intractable, without status migrainosus: Secondary | ICD-10-CM | POA: Diagnosis not present

## 2021-07-09 DIAGNOSIS — G894 Chronic pain syndrome: Secondary | ICD-10-CM | POA: Diagnosis not present

## 2021-07-09 DIAGNOSIS — G4440 Drug-induced headache, not elsewhere classified, not intractable: Secondary | ICD-10-CM | POA: Diagnosis not present

## 2021-07-09 DIAGNOSIS — Z5181 Encounter for therapeutic drug level monitoring: Secondary | ICD-10-CM | POA: Diagnosis not present

## 2021-07-09 DIAGNOSIS — M542 Cervicalgia: Secondary | ICD-10-CM | POA: Diagnosis not present

## 2021-07-09 DIAGNOSIS — M5417 Radiculopathy, lumbosacral region: Secondary | ICD-10-CM | POA: Diagnosis not present

## 2021-07-09 DIAGNOSIS — G5603 Carpal tunnel syndrome, bilateral upper limbs: Secondary | ICD-10-CM | POA: Diagnosis not present

## 2021-07-09 DIAGNOSIS — M545 Low back pain, unspecified: Secondary | ICD-10-CM | POA: Diagnosis not present

## 2021-07-10 DIAGNOSIS — I10 Essential (primary) hypertension: Secondary | ICD-10-CM | POA: Diagnosis not present

## 2021-07-10 DIAGNOSIS — E785 Hyperlipidemia, unspecified: Secondary | ICD-10-CM | POA: Diagnosis not present

## 2021-07-22 DIAGNOSIS — E1169 Type 2 diabetes mellitus with other specified complication: Secondary | ICD-10-CM | POA: Diagnosis not present

## 2021-07-22 DIAGNOSIS — E1159 Type 2 diabetes mellitus with other circulatory complications: Secondary | ICD-10-CM | POA: Diagnosis not present

## 2021-07-22 DIAGNOSIS — G43109 Migraine with aura, not intractable, without status migrainosus: Secondary | ICD-10-CM | POA: Diagnosis not present

## 2021-07-22 DIAGNOSIS — I10 Essential (primary) hypertension: Secondary | ICD-10-CM | POA: Diagnosis not present

## 2021-07-22 DIAGNOSIS — E785 Hyperlipidemia, unspecified: Secondary | ICD-10-CM | POA: Diagnosis not present

## 2021-07-26 DIAGNOSIS — Z7984 Long term (current) use of oral hypoglycemic drugs: Secondary | ICD-10-CM | POA: Diagnosis not present

## 2021-07-26 DIAGNOSIS — E785 Hyperlipidemia, unspecified: Secondary | ICD-10-CM | POA: Diagnosis not present

## 2021-07-26 DIAGNOSIS — E1169 Type 2 diabetes mellitus with other specified complication: Secondary | ICD-10-CM | POA: Diagnosis not present

## 2021-07-26 DIAGNOSIS — I10 Essential (primary) hypertension: Secondary | ICD-10-CM | POA: Diagnosis not present

## 2021-08-29 DIAGNOSIS — G43109 Migraine with aura, not intractable, without status migrainosus: Secondary | ICD-10-CM | POA: Diagnosis not present

## 2021-08-29 DIAGNOSIS — I10 Essential (primary) hypertension: Secondary | ICD-10-CM | POA: Diagnosis not present

## 2021-08-29 DIAGNOSIS — E785 Hyperlipidemia, unspecified: Secondary | ICD-10-CM | POA: Diagnosis not present

## 2021-08-29 DIAGNOSIS — E1169 Type 2 diabetes mellitus with other specified complication: Secondary | ICD-10-CM | POA: Diagnosis not present

## 2021-08-29 DIAGNOSIS — E1159 Type 2 diabetes mellitus with other circulatory complications: Secondary | ICD-10-CM | POA: Diagnosis not present

## 2021-09-03 DIAGNOSIS — G894 Chronic pain syndrome: Secondary | ICD-10-CM | POA: Diagnosis not present

## 2021-09-03 DIAGNOSIS — G43709 Chronic migraine without aura, not intractable, without status migrainosus: Secondary | ICD-10-CM | POA: Diagnosis not present

## 2021-09-03 DIAGNOSIS — G5603 Carpal tunnel syndrome, bilateral upper limbs: Secondary | ICD-10-CM | POA: Diagnosis not present

## 2021-09-03 DIAGNOSIS — M25521 Pain in right elbow: Secondary | ICD-10-CM | POA: Diagnosis not present

## 2021-09-03 DIAGNOSIS — M7061 Trochanteric bursitis, right hip: Secondary | ICD-10-CM | POA: Diagnosis not present

## 2021-09-03 DIAGNOSIS — M797 Fibromyalgia: Secondary | ICD-10-CM | POA: Diagnosis not present

## 2021-09-03 DIAGNOSIS — M5417 Radiculopathy, lumbosacral region: Secondary | ICD-10-CM | POA: Diagnosis not present

## 2021-09-03 DIAGNOSIS — M542 Cervicalgia: Secondary | ICD-10-CM | POA: Diagnosis not present

## 2021-09-03 DIAGNOSIS — G4440 Drug-induced headache, not elsewhere classified, not intractable: Secondary | ICD-10-CM | POA: Diagnosis not present

## 2021-09-03 DIAGNOSIS — M545 Low back pain, unspecified: Secondary | ICD-10-CM | POA: Diagnosis not present

## 2021-09-17 DIAGNOSIS — G4733 Obstructive sleep apnea (adult) (pediatric): Secondary | ICD-10-CM | POA: Diagnosis not present

## 2021-09-17 DIAGNOSIS — R69 Illness, unspecified: Secondary | ICD-10-CM | POA: Diagnosis not present

## 2021-09-25 DIAGNOSIS — E1169 Type 2 diabetes mellitus with other specified complication: Secondary | ICD-10-CM | POA: Diagnosis not present

## 2021-09-25 DIAGNOSIS — E1159 Type 2 diabetes mellitus with other circulatory complications: Secondary | ICD-10-CM | POA: Diagnosis not present

## 2021-09-25 DIAGNOSIS — G43109 Migraine with aura, not intractable, without status migrainosus: Secondary | ICD-10-CM | POA: Diagnosis not present

## 2021-09-25 DIAGNOSIS — E785 Hyperlipidemia, unspecified: Secondary | ICD-10-CM | POA: Diagnosis not present

## 2021-09-25 DIAGNOSIS — I10 Essential (primary) hypertension: Secondary | ICD-10-CM | POA: Diagnosis not present

## 2021-10-15 ENCOUNTER — Telehealth: Payer: Self-pay

## 2021-10-15 DIAGNOSIS — B009 Herpesviral infection, unspecified: Secondary | ICD-10-CM

## 2021-10-15 MED ORDER — VALACYCLOVIR HCL 500 MG PO TABS: 500.0000 mg | ORAL_TABLET | Freq: Two times a day (BID) | ORAL | 99 refills | Status: AC

## 2021-10-15 NOTE — Telephone Encounter (Signed)
VM left on nurse line by [facility] staff member [facility] stating patient needs prescription for Valtrex. Katelyn called the office a second time and was transferred to clinical staff. Requests Valtrex prescription if this is still able to be prescribed.   Called pt. Pt verifies she has been having cold sores on her mouth. Has had some bumps near vagina but is unsure if herpes lesion or hair follicle infected. Prescription sent for Valtrex to be refilled PRN for 1 year per protocol. Pt would like to see Any[facility] again for follow up. Will have front office schedule.

## 2021-10-21 DIAGNOSIS — I10 Essential (primary) hypertension: Secondary | ICD-10-CM | POA: Diagnosis not present

## 2021-10-21 DIAGNOSIS — E1159 Type 2 diabetes mellitus with other circulatory complications: Secondary | ICD-10-CM | POA: Diagnosis not present

## 2021-10-21 DIAGNOSIS — E785 Hyperlipidemia, unspecified: Secondary | ICD-10-CM | POA: Diagnosis not present

## 2021-10-22 DIAGNOSIS — Z23 Encounter for immunization: Secondary | ICD-10-CM | POA: Diagnosis not present

## 2021-11-06 DIAGNOSIS — G4733 Obstructive sleep apnea (adult) (pediatric): Secondary | ICD-10-CM | POA: Diagnosis not present

## 2021-11-12 DIAGNOSIS — M79601 Pain in right arm: Secondary | ICD-10-CM | POA: Diagnosis not present

## 2021-11-12 DIAGNOSIS — M542 Cervicalgia: Secondary | ICD-10-CM | POA: Diagnosis not present

## 2021-11-12 DIAGNOSIS — M25521 Pain in right elbow: Secondary | ICD-10-CM | POA: Diagnosis not present

## 2021-11-12 DIAGNOSIS — G5603 Carpal tunnel syndrome, bilateral upper limbs: Secondary | ICD-10-CM | POA: Diagnosis not present

## 2021-11-12 DIAGNOSIS — R200 Anesthesia of skin: Secondary | ICD-10-CM | POA: Diagnosis not present

## 2021-11-12 DIAGNOSIS — M7061 Trochanteric bursitis, right hip: Secondary | ICD-10-CM | POA: Diagnosis not present

## 2021-11-12 DIAGNOSIS — M5417 Radiculopathy, lumbosacral region: Secondary | ICD-10-CM | POA: Diagnosis not present

## 2021-11-12 DIAGNOSIS — M545 Low back pain, unspecified: Secondary | ICD-10-CM | POA: Diagnosis not present

## 2021-11-12 DIAGNOSIS — G4440 Drug-induced headache, not elsewhere classified, not intractable: Secondary | ICD-10-CM | POA: Diagnosis not present

## 2021-11-12 DIAGNOSIS — R29898 Other symptoms and signs involving the musculoskeletal system: Secondary | ICD-10-CM | POA: Diagnosis not present

## 2021-11-12 DIAGNOSIS — G43709 Chronic migraine without aura, not intractable, without status migrainosus: Secondary | ICD-10-CM | POA: Diagnosis not present

## 2021-11-12 DIAGNOSIS — M797 Fibromyalgia: Secondary | ICD-10-CM | POA: Diagnosis not present

## 2021-11-27 DIAGNOSIS — E1159 Type 2 diabetes mellitus with other circulatory complications: Secondary | ICD-10-CM | POA: Diagnosis not present

## 2021-11-27 DIAGNOSIS — E785 Hyperlipidemia, unspecified: Secondary | ICD-10-CM | POA: Diagnosis not present

## 2021-11-27 DIAGNOSIS — I10 Essential (primary) hypertension: Secondary | ICD-10-CM | POA: Diagnosis not present

## 2021-12-03 DIAGNOSIS — H40012 Open angle with borderline findings, low risk, left eye: Secondary | ICD-10-CM | POA: Diagnosis not present

## 2021-12-10 DIAGNOSIS — G4733 Obstructive sleep apnea (adult) (pediatric): Secondary | ICD-10-CM | POA: Diagnosis not present

## 2021-12-12 ENCOUNTER — Ambulatory Visit (INDEPENDENT_AMBULATORY_CARE_PROVIDER_SITE_OTHER): Payer: Medicare Other | Admitting: Obstetrics & Gynecology

## 2021-12-12 ENCOUNTER — Encounter: Payer: Self-pay | Admitting: Obstetrics & Gynecology

## 2021-12-12 ENCOUNTER — Other Ambulatory Visit (HOSPITAL_COMMUNITY)
Admission: RE | Admit: 2021-12-12 | Discharge: 2021-12-12 | Disposition: A | Discharge status: Home / Self Care | Payer: Medicare Other | Source: Ambulatory Visit | Attending: Obstetrics & Gynecology | Admitting: Obstetrics & Gynecology

## 2021-12-12 VITALS — BP 139/86 | HR 79 | Ht 65.0 in | Wt 266.4 lb

## 2021-12-12 DIAGNOSIS — N898 Other specified noninflammatory disorders of vagina: Secondary | ICD-10-CM | POA: Diagnosis present

## 2021-12-12 NOTE — Progress Notes (Signed)
? ?GYNECOLOGY OFFICE VISIT NOTE ? ?History:  ? Margaret [name] is a 61 y.o. [phone] s/p TAH 12 years ago for fibroids here today for evaluation of increased vaginal discharge.  No irritation. Has been ongoing for about a couple of months. Clear discharge. She denies any abnormal vaginal discharge, bleeding, pelvic pain or other concerns.  ?  ?Past Medical History:  ?Diagnosis Date  ? Allergy   ? Anemia   ? Anxiety   ? Arthritis   ? knees/elbows  ? Chronic back pain   ? Depression   ? Diabetes mellitus without complication ([place])   ? type 2  ? Fibroids   ? Fibromyalgia   ? History of blood product transfusion   ? HSV infection   ? Hyperlipidemia   ? Hypertension   ? Menorrhagia   ? Migraines   ? not on medication  ? Morbid obesity ([place])   ? Right carpal tunnel syndrome   ? Sleep apnea   ? does not use cpap  ? ? ?Past Surgical History:  ?Procedure Laterality Date  ? ABDOMINAL HYSTERECTOMY  07/14/2012  ? Procedure: HYSTERECTOMY ABDOMINAL;  Surgeon: Emily Filbert, MD;  Location: [facility];  Service: Gynecology;  Laterality: N/A;  ? CESAREAN SECTION    ? x2  ? COLONOSCOPY    ? normal  ? endomerial ablation    ? ENDOMETRIAL ABLATION    ? I & D EXTREMITY Left 08/23/2019  ? Procedure: IRRIGATION AND DEBRIDEMENT EXTREMITY left index finger;  Surgeon: Leanora Cover, MD;  Location: [facility];  Service: Orthopedics;  Laterality: Left;  ? LEFT HEART CATH AND CORONARY ANGIOGRAPHY N/A 06/11/2017  ? Procedure: LEFT HEART CATH AND CORONARY ANGIOGRAPHY;  Surgeon: Charolette Forward, MD;  Location: [facility];  Service: Cardiovascular;  Laterality: N/A;  ? NECK SURGERY    ? twice  ? ROBOTIC ASSISTED LAPAROSCOPIC LYSIS OF ADHESION  07/14/2012  ? Procedure: ROBOTIC ASSISTED LAPAROSCOPIC LYSIS OF ADHESION;  Surgeon: Emily Filbert, MD;  Location: [facility];  Service: Gynecology;  Laterality: N/A;  ? TUBAL LIGATION    ? WISDOM TOOTH EXTRACTION    ? ? ?The following portions of the patient's history were reviewed and updated as appropriate: allergies,  current medications, past family history, past medical history, past social history, past surgical history and problem list.  ? ?Health Maintenance:  Normal mammogram on 05/17/2021.  ? ?Review of Systems:  ?Pertinent items noted in HPI and remainder of comprehensive ROS otherwise negative. ? ?Physical Exam:  ?BP 139/86   Pulse 79   Ht '5\' 5"'$  (1.651 m)   Wt 266 lb 6.4 oz (120.8 kg)   LMP 03/30/2011   BMI 44.33 kg/m?  ?CONSTITUTIONAL: Well-developed, well-nourished female in no acute distress.  ?HEENT:  Normocephalic, atraumatic. External right and left ear normal. No scleral icterus.  ?NECK: Normal range of motion, supple, no masses noted on observation ?SKIN: No rash noted. Not diaphoretic. No erythema. No pallor. ?MUSCULOSKELETAL: Normal range of motion. No edema noted. ?NEUROLOGIC: Alert and oriented to person, place, and time. Normal muscle tone coordination. No cranial nerve deficit noted. ?PSYCHIATRIC: Normal mood and affect. Normal behavior. Normal judgment and thought content. ?CARDIOVASCULAR: Normal heart rate noted ?RESPIRATORY: Effort and breath sounds normal, no problems with respiration noted ?ABDOMEN: No masses noted. No other overt distention noted.   ?PELVIC: Normal appearing external genitalia with mild atrophy; normal urethral meatus; normal appearing vaginal mucosa and cuff. Small amount of thin, white vaginal discharge noted, testing sample  obtained.   Performed in the presence of a chaperone ?    ?Assessment and Plan:  ?   ?1. Vaginal discharge ?- Cervicovaginal ancillary only done. will follow up results and manage accordingly. ?Proper vulvar hygiene emphasized: discussed avoidance of perfumed soaps, detergents, lotions and any type of douches; in addition to wearing cotton underwear and no underwear at night.  Also recommended cleaning front to back, voiding and cleaning up after intercourse or exercise. All questions answered. ? ?Routine preventative health maintenance measures  emphasized. ?Please refer to After Visit Summary for other counseling recommendations.  ? ?Return for any gynecologic concerns.   ? ?I spent 20 minutes dedicated to the care of this patient including pre-visit review of records, face to face time with the patient discussing her conditions and treatments and post visit orders. ? ? ? ?Verita Schneiders, MD, FACOG ?Obstetrician Social research officer, government, Faculty Practice ?Center for [place] ? ? ? ? ? ? ?

## 2021-12-13 LAB — CERVICOVAGINAL ANCILLARY ONLY
Bacterial Vaginitis (gardnerella): NEGATIVE
Candida Glabrata: NEGATIVE
Candida Vaginitis: NEGATIVE
Comment: NEGATIVE
Comment: NEGATIVE
Comment: NEGATIVE

## 2021-12-16 DIAGNOSIS — G4733 Obstructive sleep apnea (adult) (pediatric): Secondary | ICD-10-CM | POA: Diagnosis not present

## 2021-12-18 DIAGNOSIS — I10 Essential (primary) hypertension: Secondary | ICD-10-CM | POA: Diagnosis not present

## 2021-12-18 DIAGNOSIS — E1159 Type 2 diabetes mellitus with other circulatory complications: Secondary | ICD-10-CM | POA: Diagnosis not present

## 2021-12-18 DIAGNOSIS — E785 Hyperlipidemia, unspecified: Secondary | ICD-10-CM | POA: Diagnosis not present

## 2022-01-01 DIAGNOSIS — R0600 Dyspnea, unspecified: Secondary | ICD-10-CM | POA: Diagnosis not present

## 2022-01-01 DIAGNOSIS — E785 Hyperlipidemia, unspecified: Secondary | ICD-10-CM | POA: Diagnosis not present

## 2022-01-01 DIAGNOSIS — E119 Type 2 diabetes mellitus without complications: Secondary | ICD-10-CM | POA: Diagnosis not present

## 2022-01-01 DIAGNOSIS — I10 Essential (primary) hypertension: Secondary | ICD-10-CM | POA: Diagnosis not present

## 2022-01-04 DIAGNOSIS — G4733 Obstructive sleep apnea (adult) (pediatric): Secondary | ICD-10-CM | POA: Diagnosis not present

## 2022-01-07 DIAGNOSIS — M542 Cervicalgia: Secondary | ICD-10-CM | POA: Diagnosis not present

## 2022-01-07 DIAGNOSIS — G8929 Other chronic pain: Secondary | ICD-10-CM | POA: Diagnosis not present

## 2022-01-07 DIAGNOSIS — M545 Low back pain, unspecified: Secondary | ICD-10-CM | POA: Diagnosis not present

## 2022-01-07 DIAGNOSIS — G43709 Chronic migraine without aura, not intractable, without status migrainosus: Secondary | ICD-10-CM | POA: Diagnosis not present

## 2022-01-07 DIAGNOSIS — M5417 Radiculopathy, lumbosacral region: Secondary | ICD-10-CM | POA: Diagnosis not present

## 2022-01-13 DIAGNOSIS — E785 Hyperlipidemia, unspecified: Secondary | ICD-10-CM | POA: Diagnosis not present

## 2022-01-13 DIAGNOSIS — G43109 Migraine with aura, not intractable, without status migrainosus: Secondary | ICD-10-CM | POA: Diagnosis not present

## 2022-01-13 DIAGNOSIS — I10 Essential (primary) hypertension: Secondary | ICD-10-CM | POA: Diagnosis not present

## 2022-01-13 DIAGNOSIS — E1159 Type 2 diabetes mellitus with other circulatory complications: Secondary | ICD-10-CM | POA: Diagnosis not present

## 2022-01-15 DIAGNOSIS — G4733 Obstructive sleep apnea (adult) (pediatric): Secondary | ICD-10-CM | POA: Diagnosis not present

## 2022-01-28 ENCOUNTER — Ambulatory Visit
Admission: RE | Admit: 2022-01-28 | Discharge: 2022-01-28 | Disposition: A | Discharge status: Home / Self Care | Payer: Medicare Other | Source: Ambulatory Visit | Attending: Internal Medicine | Admitting: Internal Medicine

## 2022-01-28 ENCOUNTER — Other Ambulatory Visit: Payer: Self-pay | Admitting: Internal Medicine

## 2022-01-28 DIAGNOSIS — E1169 Type 2 diabetes mellitus with other specified complication: Secondary | ICD-10-CM | POA: Diagnosis not present

## 2022-01-28 DIAGNOSIS — M47812 Spondylosis without myelopathy or radiculopathy, cervical region: Secondary | ICD-10-CM | POA: Diagnosis not present

## 2022-01-28 DIAGNOSIS — R222 Localized swelling, mass and lump, trunk: Secondary | ICD-10-CM | POA: Diagnosis not present

## 2022-01-28 DIAGNOSIS — Z981 Arthrodesis status: Secondary | ICD-10-CM | POA: Diagnosis not present

## 2022-01-28 DIAGNOSIS — M79601 Pain in right arm: Secondary | ICD-10-CM

## 2022-01-28 DIAGNOSIS — M19011 Primary osteoarthritis, right shoulder: Secondary | ICD-10-CM | POA: Diagnosis not present

## 2022-01-28 DIAGNOSIS — M542 Cervicalgia: Secondary | ICD-10-CM | POA: Diagnosis not present

## 2022-01-28 DIAGNOSIS — W19XXXA Unspecified fall, initial encounter: Secondary | ICD-10-CM | POA: Diagnosis not present

## 2022-01-28 DIAGNOSIS — I10 Essential (primary) hypertension: Secondary | ICD-10-CM | POA: Diagnosis not present

## 2022-02-15 DIAGNOSIS — G4733 Obstructive sleep apnea (adult) (pediatric): Secondary | ICD-10-CM | POA: Diagnosis not present

## 2022-02-17 DIAGNOSIS — Z0000 Encounter for general adult medical examination without abnormal findings: Secondary | ICD-10-CM | POA: Diagnosis not present

## 2022-02-17 DIAGNOSIS — M797 Fibromyalgia: Secondary | ICD-10-CM | POA: Diagnosis not present

## 2022-02-17 DIAGNOSIS — M545 Low back pain, unspecified: Secondary | ICD-10-CM | POA: Diagnosis not present

## 2022-02-17 DIAGNOSIS — G629 Polyneuropathy, unspecified: Secondary | ICD-10-CM | POA: Diagnosis not present

## 2022-02-17 DIAGNOSIS — G4733 Obstructive sleep apnea (adult) (pediatric): Secondary | ICD-10-CM | POA: Diagnosis not present

## 2022-02-17 DIAGNOSIS — E785 Hyperlipidemia, unspecified: Secondary | ICD-10-CM | POA: Diagnosis not present

## 2022-02-17 DIAGNOSIS — G43109 Migraine with aura, not intractable, without status migrainosus: Secondary | ICD-10-CM | POA: Diagnosis not present

## 2022-02-17 DIAGNOSIS — I10 Essential (primary) hypertension: Secondary | ICD-10-CM | POA: Diagnosis not present

## 2022-02-17 DIAGNOSIS — G894 Chronic pain syndrome: Secondary | ICD-10-CM | POA: Diagnosis not present

## 2022-02-17 DIAGNOSIS — E1169 Type 2 diabetes mellitus with other specified complication: Secondary | ICD-10-CM | POA: Diagnosis not present

## 2022-02-17 DIAGNOSIS — E1159 Type 2 diabetes mellitus with other circulatory complications: Secondary | ICD-10-CM | POA: Diagnosis not present

## 2022-02-17 DIAGNOSIS — M5451 Vertebrogenic low back pain: Secondary | ICD-10-CM | POA: Diagnosis not present

## 2022-02-19 DIAGNOSIS — E1159 Type 2 diabetes mellitus with other circulatory complications: Secondary | ICD-10-CM | POA: Diagnosis not present

## 2022-02-19 DIAGNOSIS — I10 Essential (primary) hypertension: Secondary | ICD-10-CM | POA: Diagnosis not present

## 2022-02-19 DIAGNOSIS — E785 Hyperlipidemia, unspecified: Secondary | ICD-10-CM | POA: Diagnosis not present

## 2022-03-06 DIAGNOSIS — M62830 Muscle spasm of back: Secondary | ICD-10-CM | POA: Diagnosis not present

## 2022-03-06 DIAGNOSIS — E1159 Type 2 diabetes mellitus with other circulatory complications: Secondary | ICD-10-CM | POA: Diagnosis not present

## 2022-03-12 DIAGNOSIS — M545 Low back pain, unspecified: Secondary | ICD-10-CM | POA: Diagnosis not present

## 2022-03-12 DIAGNOSIS — G8929 Other chronic pain: Secondary | ICD-10-CM | POA: Diagnosis not present

## 2022-03-12 DIAGNOSIS — M5417 Radiculopathy, lumbosacral region: Secondary | ICD-10-CM | POA: Diagnosis not present

## 2022-03-12 DIAGNOSIS — M542 Cervicalgia: Secondary | ICD-10-CM | POA: Diagnosis not present

## 2022-03-12 DIAGNOSIS — G43709 Chronic migraine without aura, not intractable, without status migrainosus: Secondary | ICD-10-CM | POA: Diagnosis not present

## 2022-03-17 DIAGNOSIS — G4733 Obstructive sleep apnea (adult) (pediatric): Secondary | ICD-10-CM | POA: Diagnosis not present

## 2022-03-24 DIAGNOSIS — E1159 Type 2 diabetes mellitus with other circulatory complications: Secondary | ICD-10-CM | POA: Diagnosis not present

## 2022-03-24 DIAGNOSIS — G4733 Obstructive sleep apnea (adult) (pediatric): Secondary | ICD-10-CM | POA: Diagnosis not present

## 2022-03-24 DIAGNOSIS — I10 Essential (primary) hypertension: Secondary | ICD-10-CM | POA: Diagnosis not present

## 2022-03-27 DIAGNOSIS — E1159 Type 2 diabetes mellitus with other circulatory complications: Secondary | ICD-10-CM | POA: Diagnosis not present

## 2022-03-27 DIAGNOSIS — E1169 Type 2 diabetes mellitus with other specified complication: Secondary | ICD-10-CM | POA: Diagnosis not present

## 2022-03-27 DIAGNOSIS — G43109 Migraine with aura, not intractable, without status migrainosus: Secondary | ICD-10-CM | POA: Diagnosis not present

## 2022-03-27 DIAGNOSIS — I10 Essential (primary) hypertension: Secondary | ICD-10-CM | POA: Diagnosis not present

## 2022-03-27 DIAGNOSIS — E785 Hyperlipidemia, unspecified: Secondary | ICD-10-CM | POA: Diagnosis not present

## 2022-04-04 DIAGNOSIS — E785 Hyperlipidemia, unspecified: Secondary | ICD-10-CM | POA: Diagnosis not present

## 2022-04-04 DIAGNOSIS — E119 Type 2 diabetes mellitus without complications: Secondary | ICD-10-CM | POA: Diagnosis not present

## 2022-04-04 DIAGNOSIS — G4730 Sleep apnea, unspecified: Secondary | ICD-10-CM | POA: Diagnosis not present

## 2022-04-04 DIAGNOSIS — I10 Essential (primary) hypertension: Secondary | ICD-10-CM | POA: Diagnosis not present

## 2022-04-04 DIAGNOSIS — G4733 Obstructive sleep apnea (adult) (pediatric): Secondary | ICD-10-CM | POA: Diagnosis not present

## 2022-04-14 DIAGNOSIS — E785 Hyperlipidemia, unspecified: Secondary | ICD-10-CM | POA: Diagnosis not present

## 2022-04-14 DIAGNOSIS — I10 Essential (primary) hypertension: Secondary | ICD-10-CM | POA: Diagnosis not present

## 2022-04-14 DIAGNOSIS — E1159 Type 2 diabetes mellitus with other circulatory complications: Secondary | ICD-10-CM | POA: Diagnosis not present

## 2022-04-14 DIAGNOSIS — E8889 Other specified metabolic disorders: Secondary | ICD-10-CM | POA: Diagnosis not present

## 2022-04-14 DIAGNOSIS — R5383 Other fatigue: Secondary | ICD-10-CM | POA: Diagnosis not present

## 2022-04-14 DIAGNOSIS — G4733 Obstructive sleep apnea (adult) (pediatric): Secondary | ICD-10-CM | POA: Diagnosis not present

## 2022-04-14 DIAGNOSIS — M797 Fibromyalgia: Secondary | ICD-10-CM | POA: Diagnosis not present

## 2022-04-17 DIAGNOSIS — G4733 Obstructive sleep apnea (adult) (pediatric): Secondary | ICD-10-CM | POA: Diagnosis not present

## 2022-04-28 ENCOUNTER — Other Ambulatory Visit: Payer: Self-pay | Admitting: Internal Medicine

## 2022-04-28 DIAGNOSIS — G4733 Obstructive sleep apnea (adult) (pediatric): Secondary | ICD-10-CM | POA: Diagnosis not present

## 2022-04-28 DIAGNOSIS — Z1231 Encounter for screening mammogram for malignant neoplasm of breast: Secondary | ICD-10-CM

## 2022-04-28 DIAGNOSIS — E1159 Type 2 diabetes mellitus with other circulatory complications: Secondary | ICD-10-CM | POA: Diagnosis not present

## 2022-05-05 DIAGNOSIS — G43109 Migraine with aura, not intractable, without status migrainosus: Secondary | ICD-10-CM | POA: Diagnosis not present

## 2022-05-05 DIAGNOSIS — E1159 Type 2 diabetes mellitus with other circulatory complications: Secondary | ICD-10-CM | POA: Diagnosis not present

## 2022-05-05 DIAGNOSIS — E785 Hyperlipidemia, unspecified: Secondary | ICD-10-CM | POA: Diagnosis not present

## 2022-05-05 DIAGNOSIS — I10 Essential (primary) hypertension: Secondary | ICD-10-CM | POA: Diagnosis not present

## 2022-05-05 DIAGNOSIS — G4733 Obstructive sleep apnea (adult) (pediatric): Secondary | ICD-10-CM | POA: Diagnosis not present

## 2022-05-15 DIAGNOSIS — M5417 Radiculopathy, lumbosacral region: Secondary | ICD-10-CM | POA: Diagnosis not present

## 2022-05-15 DIAGNOSIS — G43709 Chronic migraine without aura, not intractable, without status migrainosus: Secondary | ICD-10-CM | POA: Diagnosis not present

## 2022-05-15 DIAGNOSIS — M542 Cervicalgia: Secondary | ICD-10-CM | POA: Diagnosis not present

## 2022-05-15 DIAGNOSIS — G8929 Other chronic pain: Secondary | ICD-10-CM | POA: Diagnosis not present

## 2022-05-15 DIAGNOSIS — M545 Low back pain, unspecified: Secondary | ICD-10-CM | POA: Diagnosis not present

## 2022-05-18 DIAGNOSIS — G4733 Obstructive sleep apnea (adult) (pediatric): Secondary | ICD-10-CM | POA: Diagnosis not present

## 2022-05-19 DIAGNOSIS — E1159 Type 2 diabetes mellitus with other circulatory complications: Secondary | ICD-10-CM | POA: Diagnosis not present

## 2022-05-22 ENCOUNTER — Ambulatory Visit: Payer: Medicare Other

## 2022-05-28 ENCOUNTER — Ambulatory Visit
Admission: RE | Admit: 2022-05-28 | Discharge: 2022-05-28 | Disposition: A | Discharge status: Home / Self Care | Payer: Medicare Other | Source: Ambulatory Visit | Attending: Internal Medicine | Admitting: Internal Medicine

## 2022-05-28 DIAGNOSIS — E1159 Type 2 diabetes mellitus with other circulatory complications: Secondary | ICD-10-CM | POA: Diagnosis not present

## 2022-05-28 DIAGNOSIS — I10 Essential (primary) hypertension: Secondary | ICD-10-CM | POA: Diagnosis not present

## 2022-05-28 DIAGNOSIS — E785 Hyperlipidemia, unspecified: Secondary | ICD-10-CM | POA: Diagnosis not present

## 2022-05-28 DIAGNOSIS — Z1231 Encounter for screening mammogram for malignant neoplasm of breast: Secondary | ICD-10-CM | POA: Diagnosis not present

## 2022-05-28 DIAGNOSIS — G43109 Migraine with aura, not intractable, without status migrainosus: Secondary | ICD-10-CM | POA: Diagnosis not present

## 2022-06-05 DIAGNOSIS — G4733 Obstructive sleep apnea (adult) (pediatric): Secondary | ICD-10-CM | POA: Diagnosis not present

## 2022-06-05 DIAGNOSIS — I10 Essential (primary) hypertension: Secondary | ICD-10-CM | POA: Diagnosis not present

## 2022-06-05 DIAGNOSIS — E1159 Type 2 diabetes mellitus with other circulatory complications: Secondary | ICD-10-CM | POA: Diagnosis not present

## 2022-06-11 DIAGNOSIS — E1159 Type 2 diabetes mellitus with other circulatory complications: Secondary | ICD-10-CM | POA: Diagnosis not present

## 2022-06-11 DIAGNOSIS — I10 Essential (primary) hypertension: Secondary | ICD-10-CM | POA: Diagnosis not present

## 2022-06-17 DIAGNOSIS — G4733 Obstructive sleep apnea (adult) (pediatric): Secondary | ICD-10-CM | POA: Diagnosis not present

## 2022-06-24 DIAGNOSIS — H40012 Open angle with borderline findings, low risk, left eye: Secondary | ICD-10-CM | POA: Diagnosis not present

## 2022-06-27 DIAGNOSIS — E119 Type 2 diabetes mellitus without complications: Secondary | ICD-10-CM | POA: Diagnosis not present

## 2022-06-27 DIAGNOSIS — E785 Hyperlipidemia, unspecified: Secondary | ICD-10-CM | POA: Diagnosis not present

## 2022-06-27 DIAGNOSIS — G4733 Obstructive sleep apnea (adult) (pediatric): Secondary | ICD-10-CM | POA: Diagnosis not present

## 2022-06-27 DIAGNOSIS — I10 Essential (primary) hypertension: Secondary | ICD-10-CM | POA: Diagnosis not present

## 2022-07-03 DIAGNOSIS — E1159 Type 2 diabetes mellitus with other circulatory complications: Secondary | ICD-10-CM | POA: Diagnosis not present

## 2022-07-03 DIAGNOSIS — G4733 Obstructive sleep apnea (adult) (pediatric): Secondary | ICD-10-CM | POA: Diagnosis not present

## 2022-07-03 DIAGNOSIS — E785 Hyperlipidemia, unspecified: Secondary | ICD-10-CM | POA: Diagnosis not present

## 2022-07-03 DIAGNOSIS — I10 Essential (primary) hypertension: Secondary | ICD-10-CM | POA: Diagnosis not present

## 2022-07-09 DIAGNOSIS — E1159 Type 2 diabetes mellitus with other circulatory complications: Secondary | ICD-10-CM | POA: Diagnosis not present

## 2022-07-09 DIAGNOSIS — K5903 Drug induced constipation: Secondary | ICD-10-CM | POA: Diagnosis not present

## 2022-07-18 DIAGNOSIS — G4733 Obstructive sleep apnea (adult) (pediatric): Secondary | ICD-10-CM | POA: Diagnosis not present

## 2022-07-22 DIAGNOSIS — E1159 Type 2 diabetes mellitus with other circulatory complications: Secondary | ICD-10-CM | POA: Diagnosis not present

## 2022-07-22 DIAGNOSIS — J40 Bronchitis, not specified as acute or chronic: Secondary | ICD-10-CM | POA: Diagnosis not present

## 2022-07-22 DIAGNOSIS — I10 Essential (primary) hypertension: Secondary | ICD-10-CM | POA: Diagnosis not present

## 2022-07-28 DIAGNOSIS — M545 Low back pain, unspecified: Secondary | ICD-10-CM | POA: Diagnosis not present

## 2022-07-28 DIAGNOSIS — E1159 Type 2 diabetes mellitus with other circulatory complications: Secondary | ICD-10-CM | POA: Diagnosis not present

## 2022-07-28 DIAGNOSIS — G43709 Chronic migraine without aura, not intractable, without status migrainosus: Secondary | ICD-10-CM | POA: Diagnosis not present

## 2022-07-28 DIAGNOSIS — M5417 Radiculopathy, lumbosacral region: Secondary | ICD-10-CM | POA: Diagnosis not present

## 2022-07-28 DIAGNOSIS — M542 Cervicalgia: Secondary | ICD-10-CM | POA: Diagnosis not present

## 2022-07-28 DIAGNOSIS — E785 Hyperlipidemia, unspecified: Secondary | ICD-10-CM | POA: Diagnosis not present

## 2022-07-28 DIAGNOSIS — G894 Chronic pain syndrome: Secondary | ICD-10-CM | POA: Diagnosis not present

## 2022-07-28 DIAGNOSIS — G5603 Carpal tunnel syndrome, bilateral upper limbs: Secondary | ICD-10-CM | POA: Diagnosis not present

## 2022-07-28 DIAGNOSIS — I10 Essential (primary) hypertension: Secondary | ICD-10-CM | POA: Diagnosis not present

## 2022-08-06 DIAGNOSIS — E1159 Type 2 diabetes mellitus with other circulatory complications: Secondary | ICD-10-CM | POA: Diagnosis not present

## 2022-08-06 DIAGNOSIS — K5903 Drug induced constipation: Secondary | ICD-10-CM | POA: Diagnosis not present

## 2022-08-11 DIAGNOSIS — G43109 Migraine with aura, not intractable, without status migrainosus: Secondary | ICD-10-CM | POA: Diagnosis not present

## 2022-08-11 DIAGNOSIS — I10 Essential (primary) hypertension: Secondary | ICD-10-CM | POA: Diagnosis not present

## 2022-08-11 DIAGNOSIS — E1159 Type 2 diabetes mellitus with other circulatory complications: Secondary | ICD-10-CM | POA: Diagnosis not present

## 2022-08-11 DIAGNOSIS — E785 Hyperlipidemia, unspecified: Secondary | ICD-10-CM | POA: Diagnosis not present

## 2022-09-09 DIAGNOSIS — E1159 Type 2 diabetes mellitus with other circulatory complications: Secondary | ICD-10-CM | POA: Diagnosis not present

## 2022-09-23 DIAGNOSIS — G43109 Migraine with aura, not intractable, without status migrainosus: Secondary | ICD-10-CM | POA: Diagnosis not present

## 2022-09-23 DIAGNOSIS — I10 Essential (primary) hypertension: Secondary | ICD-10-CM | POA: Diagnosis not present

## 2022-09-23 DIAGNOSIS — E1159 Type 2 diabetes mellitus with other circulatory complications: Secondary | ICD-10-CM | POA: Diagnosis not present

## 2022-09-23 DIAGNOSIS — E785 Hyperlipidemia, unspecified: Secondary | ICD-10-CM | POA: Diagnosis not present

## 2022-09-24 DIAGNOSIS — M5417 Radiculopathy, lumbosacral region: Secondary | ICD-10-CM | POA: Diagnosis not present

## 2022-09-24 DIAGNOSIS — M542 Cervicalgia: Secondary | ICD-10-CM | POA: Diagnosis not present

## 2022-09-24 DIAGNOSIS — M545 Low back pain, unspecified: Secondary | ICD-10-CM | POA: Diagnosis not present

## 2022-09-24 DIAGNOSIS — G894 Chronic pain syndrome: Secondary | ICD-10-CM | POA: Diagnosis not present

## 2022-09-24 DIAGNOSIS — G43709 Chronic migraine without aura, not intractable, without status migrainosus: Secondary | ICD-10-CM | POA: Diagnosis not present

## 2022-09-25 DIAGNOSIS — E1159 Type 2 diabetes mellitus with other circulatory complications: Secondary | ICD-10-CM | POA: Diagnosis not present

## 2022-09-25 DIAGNOSIS — G4733 Obstructive sleep apnea (adult) (pediatric): Secondary | ICD-10-CM | POA: Diagnosis not present

## 2022-09-25 DIAGNOSIS — I10 Essential (primary) hypertension: Secondary | ICD-10-CM | POA: Diagnosis not present

## 2022-09-26 DIAGNOSIS — E785 Hyperlipidemia, unspecified: Secondary | ICD-10-CM | POA: Diagnosis not present

## 2022-09-26 DIAGNOSIS — E119 Type 2 diabetes mellitus without complications: Secondary | ICD-10-CM | POA: Diagnosis not present

## 2022-09-26 DIAGNOSIS — I10 Essential (primary) hypertension: Secondary | ICD-10-CM | POA: Diagnosis not present

## 2022-09-26 DIAGNOSIS — M797 Fibromyalgia: Secondary | ICD-10-CM | POA: Diagnosis not present

## 2022-09-30 DIAGNOSIS — Z23 Encounter for immunization: Secondary | ICD-10-CM | POA: Diagnosis not present

## 2022-10-04 DIAGNOSIS — G4733 Obstructive sleep apnea (adult) (pediatric): Secondary | ICD-10-CM | POA: Diagnosis not present

## 2022-10-07 DIAGNOSIS — E1159 Type 2 diabetes mellitus with other circulatory complications: Secondary | ICD-10-CM | POA: Diagnosis not present

## 2022-10-08 DIAGNOSIS — M4854XA Collapsed vertebra, not elsewhere classified, thoracic region, initial encounter for fracture: Secondary | ICD-10-CM | POA: Diagnosis not present

## 2022-10-08 DIAGNOSIS — Z981 Arthrodesis status: Secondary | ICD-10-CM | POA: Diagnosis not present

## 2022-10-08 DIAGNOSIS — M50323 Other cervical disc degeneration at C6-C7 level: Secondary | ICD-10-CM | POA: Diagnosis not present

## 2022-10-08 DIAGNOSIS — M5031 Other cervical disc degeneration,  high cervical region: Secondary | ICD-10-CM | POA: Diagnosis not present

## 2022-10-08 DIAGNOSIS — M4312 Spondylolisthesis, cervical region: Secondary | ICD-10-CM | POA: Diagnosis not present

## 2022-10-08 DIAGNOSIS — M47812 Spondylosis without myelopathy or radiculopathy, cervical region: Secondary | ICD-10-CM | POA: Diagnosis not present

## 2022-10-09 DIAGNOSIS — I10 Essential (primary) hypertension: Secondary | ICD-10-CM | POA: Diagnosis not present

## 2022-10-09 DIAGNOSIS — E785 Hyperlipidemia, unspecified: Secondary | ICD-10-CM | POA: Diagnosis not present

## 2022-10-09 DIAGNOSIS — G43109 Migraine with aura, not intractable, without status migrainosus: Secondary | ICD-10-CM | POA: Diagnosis not present

## 2022-10-09 DIAGNOSIS — E1159 Type 2 diabetes mellitus with other circulatory complications: Secondary | ICD-10-CM | POA: Diagnosis not present

## 2022-10-29 DIAGNOSIS — M25521 Pain in right elbow: Secondary | ICD-10-CM | POA: Diagnosis not present

## 2022-10-29 DIAGNOSIS — G8929 Other chronic pain: Secondary | ICD-10-CM | POA: Diagnosis not present

## 2022-10-29 DIAGNOSIS — M542 Cervicalgia: Secondary | ICD-10-CM | POA: Diagnosis not present

## 2022-10-29 DIAGNOSIS — G5603 Carpal tunnel syndrome, bilateral upper limbs: Secondary | ICD-10-CM | POA: Diagnosis not present

## 2022-10-29 DIAGNOSIS — M4802 Spinal stenosis, cervical region: Secondary | ICD-10-CM | POA: Diagnosis not present

## 2022-10-29 DIAGNOSIS — M545 Low back pain, unspecified: Secondary | ICD-10-CM | POA: Diagnosis not present

## 2022-10-29 DIAGNOSIS — M797 Fibromyalgia: Secondary | ICD-10-CM | POA: Diagnosis not present

## 2022-10-29 DIAGNOSIS — M7061 Trochanteric bursitis, right hip: Secondary | ICD-10-CM | POA: Diagnosis not present

## 2022-10-29 DIAGNOSIS — Z5181 Encounter for therapeutic drug level monitoring: Secondary | ICD-10-CM | POA: Diagnosis not present

## 2022-10-29 DIAGNOSIS — M5417 Radiculopathy, lumbosacral region: Secondary | ICD-10-CM | POA: Diagnosis not present

## 2022-10-29 DIAGNOSIS — G43709 Chronic migraine without aura, not intractable, without status migrainosus: Secondary | ICD-10-CM | POA: Diagnosis not present

## 2022-10-29 DIAGNOSIS — Z79899 Other long term (current) drug therapy: Secondary | ICD-10-CM | POA: Diagnosis not present

## 2022-11-05 DIAGNOSIS — Z981 Arthrodesis status: Secondary | ICD-10-CM | POA: Diagnosis not present

## 2022-11-05 DIAGNOSIS — R200 Anesthesia of skin: Secondary | ICD-10-CM | POA: Diagnosis not present

## 2022-11-05 DIAGNOSIS — M542 Cervicalgia: Secondary | ICD-10-CM | POA: Diagnosis not present

## 2022-11-05 DIAGNOSIS — R202 Paresthesia of skin: Secondary | ICD-10-CM | POA: Diagnosis not present

## 2022-11-17 ENCOUNTER — Other Ambulatory Visit: Payer: Self-pay | Admitting: Family Medicine

## 2022-11-17 DIAGNOSIS — B009 Herpesviral infection, unspecified: Secondary | ICD-10-CM

## 2022-11-18 ENCOUNTER — Telehealth: Payer: Self-pay | Admitting: *Deleted

## 2022-11-18 ENCOUNTER — Other Ambulatory Visit: Payer: Self-pay | Admitting: Family Medicine

## 2022-11-18 DIAGNOSIS — B009 Herpesviral infection, unspecified: Secondary | ICD-10-CM

## 2022-11-18 MED ORDER — VALACYCLOVIR HCL 500 MG PO TABS: 500.0000 mg | ORAL_TABLET | Freq: Two times a day (BID) | ORAL | Status: DC

## 2022-11-18 MED ORDER — VALACYCLOVIR HCL 500 MG PO TABS: 500.0000 mg | ORAL_TABLET | Freq: Two times a day (BID) | ORAL | 2 refills | Status: AC

## 2022-11-18 NOTE — Addendum Note (Signed)
Addended by: Clayton Lefort on: 11/18/2022 05:33 PM   Modules accepted: Orders

## 2022-11-18 NOTE — Telephone Encounter (Addendum)
Received a voice message today from [place] at [facility] patient needs an urgent refill of her Valcyclovir - was sent electronically from [facility] , and needs to be approved because it is sent to go out tomorrow. I reviewed chart and last order was sent in 10/15/21. I called patient and she said she was trying to get a refill before her prescription ran out and had talked with [facility].  I informed her that her prescription was expired and we will send in a new one.  I also explained she should request refill as needed based on outbreaks . She voices understanding. RX sent in per protocol for one year prn. I also called Katelyn and she explained she is calling because they also help with pharmacy requests. I explained we have talked with patient and rx sent. Staci Acosta

## 2022-11-20 DIAGNOSIS — E1159 Type 2 diabetes mellitus with other circulatory complications: Secondary | ICD-10-CM | POA: Diagnosis not present

## 2022-11-20 DIAGNOSIS — I10 Essential (primary) hypertension: Secondary | ICD-10-CM | POA: Diagnosis not present

## 2022-11-21 DIAGNOSIS — E1159 Type 2 diabetes mellitus with other circulatory complications: Secondary | ICD-10-CM | POA: Diagnosis not present

## 2022-11-21 DIAGNOSIS — I10 Essential (primary) hypertension: Secondary | ICD-10-CM | POA: Diagnosis not present

## 2022-11-21 DIAGNOSIS — E785 Hyperlipidemia, unspecified: Secondary | ICD-10-CM | POA: Diagnosis not present

## 2022-11-21 DIAGNOSIS — G43109 Migraine with aura, not intractable, without status migrainosus: Secondary | ICD-10-CM | POA: Diagnosis not present

## 2022-11-24 DIAGNOSIS — E1159 Type 2 diabetes mellitus with other circulatory complications: Secondary | ICD-10-CM | POA: Diagnosis not present

## 2022-11-24 DIAGNOSIS — E119 Type 2 diabetes mellitus without complications: Secondary | ICD-10-CM | POA: Diagnosis not present

## 2022-11-24 DIAGNOSIS — G894 Chronic pain syndrome: Secondary | ICD-10-CM | POA: Diagnosis not present

## 2022-11-24 DIAGNOSIS — I10 Essential (primary) hypertension: Secondary | ICD-10-CM | POA: Diagnosis not present

## 2022-12-25 DIAGNOSIS — I10 Essential (primary) hypertension: Secondary | ICD-10-CM | POA: Diagnosis not present

## 2022-12-25 DIAGNOSIS — E1159 Type 2 diabetes mellitus with other circulatory complications: Secondary | ICD-10-CM | POA: Diagnosis not present

## 2022-12-31 DIAGNOSIS — G43709 Chronic migraine without aura, not intractable, without status migrainosus: Secondary | ICD-10-CM | POA: Diagnosis not present

## 2022-12-31 DIAGNOSIS — M545 Low back pain, unspecified: Secondary | ICD-10-CM | POA: Diagnosis not present

## 2022-12-31 DIAGNOSIS — M5417 Radiculopathy, lumbosacral region: Secondary | ICD-10-CM | POA: Diagnosis not present

## 2022-12-31 DIAGNOSIS — M542 Cervicalgia: Secondary | ICD-10-CM | POA: Diagnosis not present

## 2022-12-31 DIAGNOSIS — G894 Chronic pain syndrome: Secondary | ICD-10-CM | POA: Diagnosis not present

## 2022-12-31 DIAGNOSIS — M25521 Pain in right elbow: Secondary | ICD-10-CM | POA: Diagnosis not present

## 2023-01-05 DIAGNOSIS — G4733 Obstructive sleep apnea (adult) (pediatric): Secondary | ICD-10-CM | POA: Diagnosis not present

## 2023-01-12 DIAGNOSIS — E119 Type 2 diabetes mellitus without complications: Secondary | ICD-10-CM | POA: Diagnosis not present

## 2023-01-12 DIAGNOSIS — M797 Fibromyalgia: Secondary | ICD-10-CM | POA: Diagnosis not present

## 2023-01-12 DIAGNOSIS — E782 Mixed hyperlipidemia: Secondary | ICD-10-CM | POA: Diagnosis not present

## 2023-01-12 DIAGNOSIS — I10 Essential (primary) hypertension: Secondary | ICD-10-CM | POA: Diagnosis not present

## 2023-01-15 ENCOUNTER — Other Ambulatory Visit: Payer: Self-pay | Admitting: Family Medicine

## 2023-01-20 DIAGNOSIS — E1159 Type 2 diabetes mellitus with other circulatory complications: Secondary | ICD-10-CM | POA: Diagnosis not present

## 2023-01-20 DIAGNOSIS — I10 Essential (primary) hypertension: Secondary | ICD-10-CM | POA: Diagnosis not present

## 2023-01-22 DIAGNOSIS — R200 Anesthesia of skin: Secondary | ICD-10-CM | POA: Diagnosis not present

## 2023-01-22 DIAGNOSIS — R202 Paresthesia of skin: Secondary | ICD-10-CM | POA: Diagnosis not present

## 2023-01-26 DIAGNOSIS — G5601 Carpal tunnel syndrome, right upper limb: Secondary | ICD-10-CM | POA: Diagnosis not present

## 2023-01-28 DIAGNOSIS — M545 Low back pain, unspecified: Secondary | ICD-10-CM | POA: Diagnosis not present

## 2023-01-28 DIAGNOSIS — G8929 Other chronic pain: Secondary | ICD-10-CM | POA: Diagnosis not present

## 2023-01-28 DIAGNOSIS — M5417 Radiculopathy, lumbosacral region: Secondary | ICD-10-CM | POA: Diagnosis not present

## 2023-01-28 DIAGNOSIS — G43709 Chronic migraine without aura, not intractable, without status migrainosus: Secondary | ICD-10-CM | POA: Diagnosis not present

## 2023-02-17 DIAGNOSIS — I10 Essential (primary) hypertension: Secondary | ICD-10-CM | POA: Diagnosis not present

## 2023-02-17 DIAGNOSIS — E1159 Type 2 diabetes mellitus with other circulatory complications: Secondary | ICD-10-CM | POA: Diagnosis not present

## 2023-02-23 DIAGNOSIS — G4733 Obstructive sleep apnea (adult) (pediatric): Secondary | ICD-10-CM | POA: Diagnosis not present

## 2023-02-23 DIAGNOSIS — Z0000 Encounter for general adult medical examination without abnormal findings: Secondary | ICD-10-CM | POA: Diagnosis not present

## 2023-02-23 DIAGNOSIS — I10 Essential (primary) hypertension: Secondary | ICD-10-CM | POA: Diagnosis not present

## 2023-02-23 DIAGNOSIS — Z1211 Encounter for screening for malignant neoplasm of colon: Secondary | ICD-10-CM | POA: Diagnosis not present

## 2023-02-23 DIAGNOSIS — G43109 Migraine with aura, not intractable, without status migrainosus: Secondary | ICD-10-CM | POA: Diagnosis not present

## 2023-02-23 DIAGNOSIS — E1159 Type 2 diabetes mellitus with other circulatory complications: Secondary | ICD-10-CM | POA: Diagnosis not present

## 2023-02-23 DIAGNOSIS — E119 Type 2 diabetes mellitus without complications: Secondary | ICD-10-CM | POA: Diagnosis not present

## 2023-02-23 DIAGNOSIS — G894 Chronic pain syndrome: Secondary | ICD-10-CM | POA: Diagnosis not present

## 2023-02-23 DIAGNOSIS — D509 Iron deficiency anemia, unspecified: Secondary | ICD-10-CM | POA: Diagnosis not present

## 2023-02-23 DIAGNOSIS — E785 Hyperlipidemia, unspecified: Secondary | ICD-10-CM | POA: Diagnosis not present

## 2023-03-05 DIAGNOSIS — D509 Iron deficiency anemia, unspecified: Secondary | ICD-10-CM | POA: Diagnosis not present

## 2023-03-18 DIAGNOSIS — E1159 Type 2 diabetes mellitus with other circulatory complications: Secondary | ICD-10-CM | POA: Diagnosis not present

## 2023-03-18 DIAGNOSIS — I10 Essential (primary) hypertension: Secondary | ICD-10-CM | POA: Diagnosis not present

## 2023-03-24 DIAGNOSIS — M542 Cervicalgia: Secondary | ICD-10-CM | POA: Diagnosis not present

## 2023-03-24 DIAGNOSIS — M4802 Spinal stenosis, cervical region: Secondary | ICD-10-CM | POA: Diagnosis not present

## 2023-03-24 DIAGNOSIS — H40012 Open angle with borderline findings, low risk, left eye: Secondary | ICD-10-CM | POA: Diagnosis not present

## 2023-03-24 DIAGNOSIS — G43709 Chronic migraine without aura, not intractable, without status migrainosus: Secondary | ICD-10-CM | POA: Diagnosis not present

## 2023-03-24 DIAGNOSIS — G894 Chronic pain syndrome: Secondary | ICD-10-CM | POA: Diagnosis not present

## 2023-03-24 DIAGNOSIS — M5417 Radiculopathy, lumbosacral region: Secondary | ICD-10-CM | POA: Diagnosis not present

## 2023-03-24 DIAGNOSIS — M545 Low back pain, unspecified: Secondary | ICD-10-CM | POA: Diagnosis not present

## 2023-04-03 DIAGNOSIS — M47812 Spondylosis without myelopathy or radiculopathy, cervical region: Secondary | ICD-10-CM | POA: Diagnosis not present

## 2023-04-13 DIAGNOSIS — G4733 Obstructive sleep apnea (adult) (pediatric): Secondary | ICD-10-CM | POA: Diagnosis not present

## 2023-04-27 ENCOUNTER — Other Ambulatory Visit: Payer: Self-pay | Admitting: Internal Medicine

## 2023-04-27 DIAGNOSIS — Z1231 Encounter for screening mammogram for malignant neoplasm of breast: Secondary | ICD-10-CM

## 2023-04-28 DIAGNOSIS — G4733 Obstructive sleep apnea (adult) (pediatric): Secondary | ICD-10-CM | POA: Diagnosis not present

## 2023-04-28 DIAGNOSIS — R0789 Other chest pain: Secondary | ICD-10-CM | POA: Diagnosis not present

## 2023-04-28 DIAGNOSIS — I10 Essential (primary) hypertension: Secondary | ICD-10-CM | POA: Diagnosis not present

## 2023-04-28 DIAGNOSIS — E119 Type 2 diabetes mellitus without complications: Secondary | ICD-10-CM | POA: Diagnosis not present

## 2023-05-12 DIAGNOSIS — I10 Essential (primary) hypertension: Secondary | ICD-10-CM | POA: Diagnosis not present

## 2023-05-12 DIAGNOSIS — E1159 Type 2 diabetes mellitus with other circulatory complications: Secondary | ICD-10-CM | POA: Diagnosis not present

## 2023-05-19 DIAGNOSIS — M545 Low back pain, unspecified: Secondary | ICD-10-CM | POA: Diagnosis not present

## 2023-05-19 DIAGNOSIS — G43709 Chronic migraine without aura, not intractable, without status migrainosus: Secondary | ICD-10-CM | POA: Diagnosis not present

## 2023-05-19 DIAGNOSIS — M5417 Radiculopathy, lumbosacral region: Secondary | ICD-10-CM | POA: Diagnosis not present

## 2023-05-19 DIAGNOSIS — M542 Cervicalgia: Secondary | ICD-10-CM | POA: Diagnosis not present

## 2023-05-19 DIAGNOSIS — G894 Chronic pain syndrome: Secondary | ICD-10-CM | POA: Diagnosis not present

## 2023-05-19 DIAGNOSIS — Z5181 Encounter for therapeutic drug level monitoring: Secondary | ICD-10-CM | POA: Diagnosis not present

## 2023-05-19 DIAGNOSIS — Z79899 Other long term (current) drug therapy: Secondary | ICD-10-CM | POA: Diagnosis not present

## 2023-05-27 ENCOUNTER — Encounter: Payer: Self-pay | Admitting: Podiatry

## 2023-05-27 ENCOUNTER — Ambulatory Visit (INDEPENDENT_AMBULATORY_CARE_PROVIDER_SITE_OTHER): Payer: 59 | Admitting: Podiatry

## 2023-05-27 DIAGNOSIS — M21611 Bunion of right foot: Secondary | ICD-10-CM | POA: Diagnosis not present

## 2023-05-27 DIAGNOSIS — E1140 Type 2 diabetes mellitus with diabetic neuropathy, unspecified: Secondary | ICD-10-CM | POA: Diagnosis not present

## 2023-05-27 DIAGNOSIS — M6740 Ganglion, unspecified site: Secondary | ICD-10-CM | POA: Diagnosis not present

## 2023-05-27 DIAGNOSIS — M21612 Bunion of left foot: Secondary | ICD-10-CM | POA: Diagnosis not present

## 2023-05-27 NOTE — Progress Notes (Signed)
Subjective:  Patient ID: Margaret [name], female    DOB: 1961/06/19,   MRN: [id number]  Chief Complaint  Patient presents with   soft tissue mass left foot    62 y.o. female presents for concern of lesion on her left foot that has come and gone. Denies any pain but wanted it evaluated. She relates no other lesions like this.  Relates burning and tingling in their feet. Patient is diabetic and last A1c was No results found for: "HGBA1C" .  Does relates a significant back history and is in pain management for this  PCP:  Lorenda Ishihara, MD    . Denies any other pedal complaints. Denies n/v/f/c.   Past Medical History:  Diagnosis Date   Allergy    Anemia    Anxiety    Arthritis    knees/elbows   Chronic back pain    Depression    Diabetes mellitus without complication ([facility])    type 2   Fibroids    Fibromyalgia    History of blood product transfusion    HSV infection    Hyperlipidemia    Hypertension    Menorrhagia    Migraines    not on medication   Morbid obesity ([facility])    Right carpal tunnel syndrome    Sleep apnea    does not use cpap    Objective:  Physical Exam: Vascular: DP/PT pulses 2/4 bilateral. CFT <3 seconds. Normal hair growth on digits. No edema.  Skin. No lacerations or abrasions bilateral feet. Nails 1-5 bilateral normal in appearance. Soft tissue mass noted to lateral dorsum of left foot with fluctuance non mobile and non tender about 0.5 cm  Musculoskeletal: MMT 5/5 bilateral lower extremities in DF, PF, Inversion and Eversion. Deceased ROM in DF of ankle joint. Bilateral moderate HAV deformities noted.  Neurological: Sensation intact to light touch.   Assessment:   1. Type 2 diabetes mellitus with diabetic neuropathy, without long-term current use of insulin ([facility])   2. Ganglion cyst   3. Bilateral bunions      Plan:  Patient was evaluated and treated and all questions answered. -Discussed and educated patient on diabetic foot care,  especially with  regards to the vascular, neurological and musculoskeletal systems.  -Stressed the importance of good glycemic control and the detriment of not  controlling glucose levels in relation to the foot. -Discussed supportive shoes at all times and checking feet regularly. . Discussed ganglion cysts and treatment options with the patient. Patient elected to defer aspiration.  Return in one year for DM foot check.     Louann Sjogren, DPM

## 2023-06-01 ENCOUNTER — Ambulatory Visit: Payer: 59

## 2023-06-09 ENCOUNTER — Ambulatory Visit
Admission: RE | Admit: 2023-06-09 | Discharge: 2023-06-09 | Disposition: A | Discharge status: Home / Self Care | Payer: 59 | Source: Ambulatory Visit | Attending: Internal Medicine | Admitting: Internal Medicine

## 2023-06-09 DIAGNOSIS — Z1231 Encounter for screening mammogram for malignant neoplasm of breast: Secondary | ICD-10-CM

## 2023-06-23 DIAGNOSIS — E1159 Type 2 diabetes mellitus with other circulatory complications: Secondary | ICD-10-CM | POA: Diagnosis not present

## 2023-06-23 DIAGNOSIS — I10 Essential (primary) hypertension: Secondary | ICD-10-CM | POA: Diagnosis not present

## 2023-07-01 ENCOUNTER — Other Ambulatory Visit: Payer: Self-pay | Admitting: Nurse Practitioner

## 2023-07-01 ENCOUNTER — Other Ambulatory Visit (HOSPITAL_COMMUNITY)
Admission: RE | Admit: 2023-07-01 | Discharge: 2023-07-01 | Disposition: A | Discharge status: Home / Self Care | Payer: 59 | Source: Ambulatory Visit | Attending: Obstetrics & Gynecology | Admitting: Obstetrics & Gynecology

## 2023-07-01 ENCOUNTER — Ambulatory Visit: Payer: 59 | Admitting: Obstetrics & Gynecology

## 2023-07-01 ENCOUNTER — Encounter: Payer: Self-pay | Admitting: Obstetrics & Gynecology

## 2023-07-01 VITALS — BP 127/84 | HR 77 | Ht 65.0 in | Wt 222.2 lb

## 2023-07-01 DIAGNOSIS — N898 Other specified noninflammatory disorders of vagina: Secondary | ICD-10-CM | POA: Insufficient documentation

## 2023-07-01 DIAGNOSIS — B3731 Acute candidiasis of vulva and vagina: Secondary | ICD-10-CM | POA: Diagnosis not present

## 2023-07-01 DIAGNOSIS — D649 Anemia, unspecified: Secondary | ICD-10-CM | POA: Diagnosis not present

## 2023-07-01 DIAGNOSIS — Z8619 Personal history of other infectious and parasitic diseases: Secondary | ICD-10-CM | POA: Diagnosis not present

## 2023-07-01 DIAGNOSIS — R239 Unspecified skin changes: Secondary | ICD-10-CM | POA: Diagnosis not present

## 2023-07-01 DIAGNOSIS — R109 Unspecified abdominal pain: Secondary | ICD-10-CM | POA: Diagnosis not present

## 2023-07-01 MED ORDER — VALACYCLOVIR HCL 500 MG PO TABS: 500.0000 mg | ORAL_TABLET | Freq: Every day | ORAL | 12 refills | Status: DC

## 2023-07-01 NOTE — Progress Notes (Signed)
GYNECOLOGY OFFICE VISIT NOTE  History:   Margaret [name] is a 62 y.o. [phone] s/p TAH for benign reasons here today for evaluation of noted skin changes outside her vulva.  Had discharge a few weeks ago and had to wear pantyliner.  History of HSV, does not think this was an outbreak but wants refill of Valtrex on hand in case this happens. Undergoing a lot of stress recently, having to take care of mom who had recent surgery and requires a lot of assistance. She denies any abnormal vaginal discharge, bleeding, pelvic pain or other concerns.    Past Medical History:  Diagnosis Date   Allergy    Anemia    Anxiety    Arthritis    knees/elbows   Chronic back pain    Depression    Diabetes mellitus without complication (HCC)    type 2   Fibroids    Fibromyalgia    History of blood product transfusion    HSV infection    Hyperlipidemia    Hypertension    Menorrhagia    Migraines    not on medication   Morbid obesity (HCC)    Right carpal tunnel syndrome    Sleep apnea    does not use cpap    Past Surgical History:  Procedure Laterality Date   ABDOMINAL HYSTERECTOMY  07/14/2012   Procedure: HYSTERECTOMY ABDOMINAL;  Surgeon: Allie Bossier, MD;  Location: [facility];  Service: Gynecology;  Laterality: N/A;   CESAREAN SECTION     x2   COLONOSCOPY     normal   endomerial ablation     ENDOMETRIAL ABLATION     I & D EXTREMITY Left 08/23/2019   Procedure: IRRIGATION AND DEBRIDEMENT EXTREMITY left index finger;  Surgeon: Betha Loa, MD;  Location: [facility];  Service: Orthopedics;  Laterality: Left;   LEFT HEART CATH AND CORONARY ANGIOGRAPHY N/A 06/11/2017   Procedure: LEFT HEART CATH AND CORONARY ANGIOGRAPHY;  Surgeon: Rinaldo Cloud, MD;  [facility]: [facility];  Service: Cardiovascular;  Laterality: N/A;   NECK SURGERY     twice   ROBOTIC ASSISTED LAPAROSCOPIC LYSIS OF ADHESION  07/14/2012   Procedure: ROBOTIC ASSISTED LAPAROSCOPIC LYSIS OF ADHESION;  Surgeon: Allie Bossier, MD;   Location: [facility];  Service: Gynecology;  Laterality: N/A;   TUBAL LIGATION     WISDOM TOOTH EXTRACTION      The following portions of the patient's history were reviewed and updated as appropriate: allergies, current medications, past family history, past medical history, past social history, past surgical history and problem list.   Health Maintenance:  Normal mammogram on 06/09/2023.   Review of Systems:  Pertinent items noted in HPI and remainder of comprehensive ROS otherwise negative.  Physical Exam:  BP 127/84   Pulse 77   Ht 5\' 5"  (1.651 m)   Wt 222 lb 3.2 oz (100.8 kg)   LMP 03/30/2011   BMI 36.98 kg/m  CONSTITUTIONAL: Well-developed, well-nourished female in no acute distress.  HEENT:  Normocephalic, atraumatic. External right and left ear normal. No scleral icterus.  NECK: Normal range of motion, supple, no masses noted on observation SKIN: No rash noted. Not diaphoretic. No erythema. No pallor. MUSCULOSKELETAL: Normal range of motion. No edema noted. NEUROLOGIC: Alert and oriented to person, place, and time. Normal muscle tone coordination. No cranial nerve deficit noted. PSYCHIATRIC: Normal mood and affect. Normal behavior. Normal judgment and thought content. CARDIOVASCULAR: Normal heart rate noted RESPIRATORY: Effort and breath sounds normal,  no problems with respiration noted ABDOMEN: No masses noted. No other overt distention noted.   PELVIC: Normal appearing external genitalia, small area of hypopigmentation bilaterally about 3 cm x 1 cm in size on inner upper thigh region, No erythema, no rash, no ulcerations. No drainage. Scant white discharge noted from vagina, testing sample obtained.  Performed in the presence of a chaperone     Assessment and Plan:    1. Vaginal discharge - Cervicovaginal ancillary testing done, will follow up results and manage accordingly.  2. H/O herpes genitalis Valtrex refilled as per request. - valACYclovir (VALTREX) 500 MG tablet;  Take 1 tablet (500 mg total) by mouth daily. Can increase to twice a day for 5 days in the event of a recurrence  Dispense: 30 tablet; Refill: 12  3. Skin change No active inflammation noted, patient reassured. Could be due to past friction/inflammation.  No intervention needed for now.   Routine preventative health maintenance measures emphasized. Please refer to After Visit Summary for other counseling recommendations.   Return for any gynecologic concerns.    I spent 25 minutes dedicated to the care of this patient including pre-visit review of records, face to face time with the patient discussing her conditions and treatments and post visit orders.    Jaynie Collins, MD, FACOG Obstetrician & Gynecologist, [facility] for [employer], [facility]

## 2023-07-02 ENCOUNTER — Other Ambulatory Visit: Payer: Self-pay | Admitting: Nurse Practitioner

## 2023-07-02 DIAGNOSIS — R7982 Elevated C-reactive protein (CRP): Secondary | ICD-10-CM

## 2023-07-02 LAB — CERVICOVAGINAL ANCILLARY ONLY
Bacterial Vaginitis (gardnerella): NEGATIVE
Candida Glabrata: NEGATIVE
Candida Vaginitis: POSITIVE — AB
Comment: NEGATIVE
Comment: NEGATIVE
Comment: NEGATIVE

## 2023-07-02 MED ORDER — FLUCONAZOLE 150 MG PO TABS: 150.0000 mg | ORAL_TABLET | ORAL | 3 refills | Status: AC

## 2023-07-02 NOTE — Addendum Note (Signed)
Addended by: Jaynie Collins A on: 07/02/2023 04:45 PM   Modules accepted: Orders

## 2023-07-08 ENCOUNTER — Other Ambulatory Visit: Payer: 59

## 2023-07-09 ENCOUNTER — Ambulatory Visit
Admission: RE | Admit: 2023-07-09 | Discharge: 2023-07-09 | Disposition: A | Discharge status: Home / Self Care | Payer: 59 | Source: Ambulatory Visit | Attending: Nurse Practitioner | Admitting: Nurse Practitioner

## 2023-07-09 DIAGNOSIS — R7982 Elevated C-reactive protein (CRP): Secondary | ICD-10-CM

## 2023-07-09 DIAGNOSIS — R109 Unspecified abdominal pain: Secondary | ICD-10-CM | POA: Diagnosis not present

## 2023-07-09 DIAGNOSIS — I700 Atherosclerosis of aorta: Secondary | ICD-10-CM | POA: Diagnosis not present

## 2023-07-09 MED ORDER — IOPAMIDOL (ISOVUE-300) INJECTION 61%
100.0000 mL | Freq: Once | INTRAVENOUS | Status: AC | PRN
  Administered 2023-07-09: 100 mL via INTRAVENOUS

## 2023-07-24 DIAGNOSIS — G4733 Obstructive sleep apnea (adult) (pediatric): Secondary | ICD-10-CM | POA: Diagnosis not present

## 2023-08-04 DIAGNOSIS — I10 Essential (primary) hypertension: Secondary | ICD-10-CM | POA: Diagnosis not present

## 2023-08-04 DIAGNOSIS — E1159 Type 2 diabetes mellitus with other circulatory complications: Secondary | ICD-10-CM | POA: Diagnosis not present

## 2023-08-18 DIAGNOSIS — G43709 Chronic migraine without aura, not intractable, without status migrainosus: Secondary | ICD-10-CM | POA: Diagnosis not present

## 2023-08-18 DIAGNOSIS — G894 Chronic pain syndrome: Secondary | ICD-10-CM | POA: Diagnosis not present

## 2023-08-18 DIAGNOSIS — M545 Low back pain, unspecified: Secondary | ICD-10-CM | POA: Diagnosis not present

## 2023-08-18 DIAGNOSIS — M542 Cervicalgia: Secondary | ICD-10-CM | POA: Diagnosis not present

## 2023-08-25 DIAGNOSIS — D509 Iron deficiency anemia, unspecified: Secondary | ICD-10-CM | POA: Diagnosis not present

## 2023-08-25 DIAGNOSIS — G894 Chronic pain syndrome: Secondary | ICD-10-CM | POA: Diagnosis not present

## 2023-08-25 DIAGNOSIS — E1159 Type 2 diabetes mellitus with other circulatory complications: Secondary | ICD-10-CM | POA: Diagnosis not present

## 2023-08-25 DIAGNOSIS — I10 Essential (primary) hypertension: Secondary | ICD-10-CM | POA: Diagnosis not present

## 2023-08-25 DIAGNOSIS — M797 Fibromyalgia: Secondary | ICD-10-CM | POA: Diagnosis not present

## 2023-08-25 DIAGNOSIS — E785 Hyperlipidemia, unspecified: Secondary | ICD-10-CM | POA: Diagnosis not present

## 2023-08-25 DIAGNOSIS — K2950 Unspecified chronic gastritis without bleeding: Secondary | ICD-10-CM | POA: Diagnosis not present

## 2023-08-25 DIAGNOSIS — E119 Type 2 diabetes mellitus without complications: Secondary | ICD-10-CM | POA: Diagnosis not present

## 2023-08-25 DIAGNOSIS — G4733 Obstructive sleep apnea (adult) (pediatric): Secondary | ICD-10-CM | POA: Diagnosis not present

## 2023-08-26 DIAGNOSIS — E782 Mixed hyperlipidemia: Secondary | ICD-10-CM | POA: Diagnosis not present

## 2023-08-26 DIAGNOSIS — E119 Type 2 diabetes mellitus without complications: Secondary | ICD-10-CM | POA: Diagnosis not present

## 2023-08-26 DIAGNOSIS — I10 Essential (primary) hypertension: Secondary | ICD-10-CM | POA: Diagnosis not present

## 2023-08-26 DIAGNOSIS — G4733 Obstructive sleep apnea (adult) (pediatric): Secondary | ICD-10-CM | POA: Diagnosis not present

## 2023-09-22 DIAGNOSIS — E1159 Type 2 diabetes mellitus with other circulatory complications: Secondary | ICD-10-CM | POA: Diagnosis not present

## 2023-09-22 DIAGNOSIS — I10 Essential (primary) hypertension: Secondary | ICD-10-CM | POA: Diagnosis not present

## 2023-10-01 DIAGNOSIS — G4733 Obstructive sleep apnea (adult) (pediatric): Secondary | ICD-10-CM | POA: Diagnosis not present

## 2023-10-27 DIAGNOSIS — G4733 Obstructive sleep apnea (adult) (pediatric): Secondary | ICD-10-CM | POA: Diagnosis not present

## 2023-11-18 DIAGNOSIS — E1159 Type 2 diabetes mellitus with other circulatory complications: Secondary | ICD-10-CM | POA: Diagnosis not present

## 2023-11-18 DIAGNOSIS — I10 Essential (primary) hypertension: Secondary | ICD-10-CM | POA: Diagnosis not present

## 2023-11-24 DIAGNOSIS — Z79899 Other long term (current) drug therapy: Secondary | ICD-10-CM | POA: Diagnosis not present

## 2023-11-24 DIAGNOSIS — Z5181 Encounter for therapeutic drug level monitoring: Secondary | ICD-10-CM | POA: Diagnosis not present

## 2023-11-24 DIAGNOSIS — M542 Cervicalgia: Secondary | ICD-10-CM | POA: Diagnosis not present

## 2023-11-24 DIAGNOSIS — G894 Chronic pain syndrome: Secondary | ICD-10-CM | POA: Diagnosis not present

## 2023-11-24 DIAGNOSIS — M5417 Radiculopathy, lumbosacral region: Secondary | ICD-10-CM | POA: Diagnosis not present

## 2023-11-24 DIAGNOSIS — M545 Low back pain, unspecified: Secondary | ICD-10-CM | POA: Diagnosis not present

## 2023-11-24 DIAGNOSIS — G43709 Chronic migraine without aura, not intractable, without status migrainosus: Secondary | ICD-10-CM | POA: Diagnosis not present

## 2023-12-01 DIAGNOSIS — G4733 Obstructive sleep apnea (adult) (pediatric): Secondary | ICD-10-CM | POA: Diagnosis not present

## 2023-12-01 DIAGNOSIS — I10 Essential (primary) hypertension: Secondary | ICD-10-CM | POA: Diagnosis not present

## 2023-12-01 DIAGNOSIS — E782 Mixed hyperlipidemia: Secondary | ICD-10-CM | POA: Diagnosis not present

## 2023-12-01 DIAGNOSIS — E119 Type 2 diabetes mellitus without complications: Secondary | ICD-10-CM | POA: Diagnosis not present

## 2023-12-08 DIAGNOSIS — G4733 Obstructive sleep apnea (adult) (pediatric): Secondary | ICD-10-CM | POA: Diagnosis not present

## 2023-12-16 DIAGNOSIS — E1159 Type 2 diabetes mellitus with other circulatory complications: Secondary | ICD-10-CM | POA: Diagnosis not present

## 2023-12-16 DIAGNOSIS — I10 Essential (primary) hypertension: Secondary | ICD-10-CM | POA: Diagnosis not present

## 2024-01-04 DIAGNOSIS — M47812 Spondylosis without myelopathy or radiculopathy, cervical region: Secondary | ICD-10-CM | POA: Diagnosis not present

## 2024-01-12 DIAGNOSIS — Z79899 Other long term (current) drug therapy: Secondary | ICD-10-CM | POA: Diagnosis not present

## 2024-01-12 DIAGNOSIS — Z136 Encounter for screening for cardiovascular disorders: Secondary | ICD-10-CM | POA: Diagnosis not present

## 2024-01-12 DIAGNOSIS — E1169 Type 2 diabetes mellitus with other specified complication: Secondary | ICD-10-CM | POA: Diagnosis not present

## 2024-01-12 DIAGNOSIS — Z1159 Encounter for screening for other viral diseases: Secondary | ICD-10-CM | POA: Diagnosis not present

## 2024-01-12 DIAGNOSIS — E559 Vitamin D deficiency, unspecified: Secondary | ICD-10-CM | POA: Diagnosis not present

## 2024-01-12 DIAGNOSIS — I700 Atherosclerosis of aorta: Secondary | ICD-10-CM | POA: Diagnosis not present

## 2024-01-12 DIAGNOSIS — D509 Iron deficiency anemia, unspecified: Secondary | ICD-10-CM | POA: Diagnosis not present

## 2024-01-12 DIAGNOSIS — Z0189 Encounter for other specified special examinations: Secondary | ICD-10-CM | POA: Diagnosis not present

## 2024-01-12 DIAGNOSIS — I10 Essential (primary) hypertension: Secondary | ICD-10-CM | POA: Diagnosis not present

## 2024-01-12 DIAGNOSIS — G4733 Obstructive sleep apnea (adult) (pediatric): Secondary | ICD-10-CM | POA: Diagnosis not present

## 2024-01-13 DIAGNOSIS — Z136 Encounter for screening for cardiovascular disorders: Secondary | ICD-10-CM | POA: Diagnosis not present

## 2024-01-13 DIAGNOSIS — Z79899 Other long term (current) drug therapy: Secondary | ICD-10-CM | POA: Diagnosis not present

## 2024-01-13 DIAGNOSIS — D509 Iron deficiency anemia, unspecified: Secondary | ICD-10-CM | POA: Diagnosis not present

## 2024-01-19 DIAGNOSIS — M4802 Spinal stenosis, cervical region: Secondary | ICD-10-CM | POA: Diagnosis not present

## 2024-01-19 DIAGNOSIS — M545 Low back pain, unspecified: Secondary | ICD-10-CM | POA: Diagnosis not present

## 2024-01-19 DIAGNOSIS — G43709 Chronic migraine without aura, not intractable, without status migrainosus: Secondary | ICD-10-CM | POA: Diagnosis not present

## 2024-01-19 DIAGNOSIS — G894 Chronic pain syndrome: Secondary | ICD-10-CM | POA: Diagnosis not present

## 2024-01-20 DIAGNOSIS — I10 Essential (primary) hypertension: Secondary | ICD-10-CM | POA: Diagnosis not present

## 2024-01-20 DIAGNOSIS — E1159 Type 2 diabetes mellitus with other circulatory complications: Secondary | ICD-10-CM | POA: Diagnosis not present

## 2024-01-22 DIAGNOSIS — I700 Atherosclerosis of aorta: Secondary | ICD-10-CM | POA: Diagnosis not present

## 2024-01-22 DIAGNOSIS — E785 Hyperlipidemia, unspecified: Secondary | ICD-10-CM | POA: Diagnosis not present

## 2024-01-22 DIAGNOSIS — I10 Essential (primary) hypertension: Secondary | ICD-10-CM | POA: Diagnosis not present

## 2024-01-22 DIAGNOSIS — D509 Iron deficiency anemia, unspecified: Secondary | ICD-10-CM | POA: Diagnosis not present

## 2024-01-22 DIAGNOSIS — Z0001 Encounter for general adult medical examination with abnormal findings: Secondary | ICD-10-CM | POA: Diagnosis not present

## 2024-01-22 DIAGNOSIS — M797 Fibromyalgia: Secondary | ICD-10-CM | POA: Diagnosis not present

## 2024-01-24 DIAGNOSIS — G4733 Obstructive sleep apnea (adult) (pediatric): Secondary | ICD-10-CM | POA: Diagnosis not present

## 2024-01-25 DIAGNOSIS — G4733 Obstructive sleep apnea (adult) (pediatric): Secondary | ICD-10-CM | POA: Diagnosis not present

## 2024-02-09 DIAGNOSIS — M47812 Spondylosis without myelopathy or radiculopathy, cervical region: Secondary | ICD-10-CM | POA: Diagnosis not present

## 2024-02-23 DIAGNOSIS — E1159 Type 2 diabetes mellitus with other circulatory complications: Secondary | ICD-10-CM | POA: Diagnosis not present

## 2024-02-23 DIAGNOSIS — I10 Essential (primary) hypertension: Secondary | ICD-10-CM | POA: Diagnosis not present

## 2024-02-25 DIAGNOSIS — G4733 Obstructive sleep apnea (adult) (pediatric): Secondary | ICD-10-CM | POA: Diagnosis not present

## 2024-03-01 DIAGNOSIS — E119 Type 2 diabetes mellitus without complications: Secondary | ICD-10-CM | POA: Diagnosis not present

## 2024-03-01 DIAGNOSIS — E782 Mixed hyperlipidemia: Secondary | ICD-10-CM | POA: Diagnosis not present

## 2024-03-01 DIAGNOSIS — I10 Essential (primary) hypertension: Secondary | ICD-10-CM | POA: Diagnosis not present

## 2024-03-01 DIAGNOSIS — G4733 Obstructive sleep apnea (adult) (pediatric): Secondary | ICD-10-CM | POA: Diagnosis not present

## 2024-03-15 DIAGNOSIS — M47812 Spondylosis without myelopathy or radiculopathy, cervical region: Secondary | ICD-10-CM | POA: Diagnosis not present

## 2024-03-15 DIAGNOSIS — M5417 Radiculopathy, lumbosacral region: Secondary | ICD-10-CM | POA: Diagnosis not present

## 2024-03-15 DIAGNOSIS — G894 Chronic pain syndrome: Secondary | ICD-10-CM | POA: Diagnosis not present

## 2024-03-15 DIAGNOSIS — M542 Cervicalgia: Secondary | ICD-10-CM | POA: Diagnosis not present

## 2024-03-15 DIAGNOSIS — M545 Low back pain, unspecified: Secondary | ICD-10-CM | POA: Diagnosis not present

## 2024-03-15 DIAGNOSIS — G43709 Chronic migraine without aura, not intractable, without status migrainosus: Secondary | ICD-10-CM | POA: Diagnosis not present

## 2024-03-26 DIAGNOSIS — G4733 Obstructive sleep apnea (adult) (pediatric): Secondary | ICD-10-CM | POA: Diagnosis not present

## 2024-04-05 DIAGNOSIS — H40012 Open angle with borderline findings, low risk, left eye: Secondary | ICD-10-CM | POA: Diagnosis not present

## 2024-04-05 DIAGNOSIS — H2512 Age-related nuclear cataract, left eye: Secondary | ICD-10-CM | POA: Diagnosis not present

## 2024-05-05 DIAGNOSIS — G4733 Obstructive sleep apnea (adult) (pediatric): Secondary | ICD-10-CM | POA: Diagnosis not present

## 2024-05-10 DIAGNOSIS — M5417 Radiculopathy, lumbosacral region: Secondary | ICD-10-CM | POA: Diagnosis not present

## 2024-05-10 DIAGNOSIS — M542 Cervicalgia: Secondary | ICD-10-CM | POA: Diagnosis not present

## 2024-05-10 DIAGNOSIS — G43709 Chronic migraine without aura, not intractable, without status migrainosus: Secondary | ICD-10-CM | POA: Diagnosis not present

## 2024-05-10 DIAGNOSIS — Z79899 Other long term (current) drug therapy: Secondary | ICD-10-CM | POA: Diagnosis not present

## 2024-05-10 DIAGNOSIS — G894 Chronic pain syndrome: Secondary | ICD-10-CM | POA: Diagnosis not present

## 2024-05-10 DIAGNOSIS — Z5181 Encounter for therapeutic drug level monitoring: Secondary | ICD-10-CM | POA: Diagnosis not present

## 2024-05-10 DIAGNOSIS — M47812 Spondylosis without myelopathy or radiculopathy, cervical region: Secondary | ICD-10-CM | POA: Diagnosis not present

## 2024-05-10 DIAGNOSIS — M545 Low back pain, unspecified: Secondary | ICD-10-CM | POA: Diagnosis not present

## 2024-05-11 DIAGNOSIS — M1711 Unilateral primary osteoarthritis, right knee: Secondary | ICD-10-CM | POA: Diagnosis not present

## 2024-05-16 DIAGNOSIS — I10 Essential (primary) hypertension: Secondary | ICD-10-CM | POA: Diagnosis not present

## 2024-05-16 DIAGNOSIS — E1159 Type 2 diabetes mellitus with other circulatory complications: Secondary | ICD-10-CM | POA: Diagnosis not present

## 2024-05-18 DIAGNOSIS — J329 Chronic sinusitis, unspecified: Secondary | ICD-10-CM | POA: Diagnosis not present

## 2024-05-18 DIAGNOSIS — R110 Nausea: Secondary | ICD-10-CM | POA: Diagnosis not present

## 2024-05-18 DIAGNOSIS — I10 Essential (primary) hypertension: Secondary | ICD-10-CM | POA: Diagnosis not present

## 2024-05-23 ENCOUNTER — Other Ambulatory Visit: Payer: Self-pay | Admitting: Nurse Practitioner

## 2024-05-23 DIAGNOSIS — Z0000 Encounter for general adult medical examination without abnormal findings: Secondary | ICD-10-CM

## 2024-05-25 ENCOUNTER — Encounter: Payer: Self-pay | Admitting: Podiatry

## 2024-05-25 ENCOUNTER — Ambulatory Visit (INDEPENDENT_AMBULATORY_CARE_PROVIDER_SITE_OTHER): Payer: 59 | Admitting: Podiatry

## 2024-05-25 DIAGNOSIS — E1140 Type 2 diabetes mellitus with diabetic neuropathy, unspecified: Secondary | ICD-10-CM

## 2024-05-25 DIAGNOSIS — D509 Iron deficiency anemia, unspecified: Secondary | ICD-10-CM | POA: Diagnosis not present

## 2024-05-25 DIAGNOSIS — Z79899 Other long term (current) drug therapy: Secondary | ICD-10-CM | POA: Diagnosis not present

## 2024-05-25 NOTE — Progress Notes (Signed)
  Subjective:  Patient ID: Margaret [name], female    DOB: Feb 09, 1961,   MRN: [id number]  Chief Complaint  Patient presents with   Diabetes    Look at my feet.  A1c - 5.9 ?    63 y.o. female presents for diabetic foot check.   Relates burning and tingling in their feet. Patient is diabetic and last A1c was No results found for: HGBA1C .  Does relates a significant back history and is in pain management for this  PCP:  Delores Rojelio Jenkins, NP    . Denies any other pedal complaints. Denies n/v/f/c.   Past Medical History:  Diagnosis Date   Allergy    Anemia    Anxiety    Arthritis    knees/elbows   Chronic back pain    Depression    Diabetes mellitus without complication (HCC)    type 2   Fibroids    Fibromyalgia    History of blood product transfusion    HSV infection    Hyperlipidemia    Hypertension    Menorrhagia    Migraines    not on medication   Morbid obesity (HCC)    Right carpal tunnel syndrome    Sleep apnea    does not use cpap    Objective:  Physical Exam: Vascular: DP/PT pulses 2/4 bilateral. CFT <3 seconds. Normal hair growth on digits. No edema.  Skin. No lacerations or abrasions bilateral feet. Nails 1-5 bilateral normal in appearance. Soft tissue mass noted to lateral dorsum of left foot with fluctuance non mobile and non tender about 0.5 cm  Musculoskeletal: MMT 5/5 bilateral lower extremities in DF, PF, Inversion and Eversion. Deceased ROM in DF of ankle joint. Bilateral moderate HAV deformities noted.  Neurological: Sensation intact to light touch.   Assessment:   1. Type 2 diabetes mellitus with diabetic neuropathy, without long-term current use of insulin  (HCC)       Plan:  Patient was evaluated and treated and all questions answered. -Discussed and educated patient on diabetic foot care, especially with  regards to the vascular, neurological and musculoskeletal systems.  -Stressed the importance of good glycemic control and the  detriment of not  controlling glucose levels in relation to the foot. -Discussed supportive shoes at all times and checking feet regularly. . -Discussed gabaentin for neuropathy symptoms but patient declined.  Return in one year for DM foot check.     Asberry Failing, DPM

## 2024-05-30 DIAGNOSIS — E119 Type 2 diabetes mellitus without complications: Secondary | ICD-10-CM | POA: Diagnosis not present

## 2024-05-30 DIAGNOSIS — I10 Essential (primary) hypertension: Secondary | ICD-10-CM | POA: Diagnosis not present

## 2024-05-30 DIAGNOSIS — E782 Mixed hyperlipidemia: Secondary | ICD-10-CM | POA: Diagnosis not present

## 2024-05-30 DIAGNOSIS — G4733 Obstructive sleep apnea (adult) (pediatric): Secondary | ICD-10-CM | POA: Diagnosis not present

## 2024-05-31 DIAGNOSIS — M25561 Pain in right knee: Secondary | ICD-10-CM | POA: Diagnosis not present

## 2024-06-06 DIAGNOSIS — H40022 Open angle with borderline findings, high risk, left eye: Secondary | ICD-10-CM | POA: Diagnosis not present

## 2024-06-14 ENCOUNTER — Ambulatory Visit

## 2024-06-15 DIAGNOSIS — M1711 Unilateral primary osteoarthritis, right knee: Secondary | ICD-10-CM | POA: Diagnosis not present

## 2024-06-23 ENCOUNTER — Ambulatory Visit
Admission: RE | Admit: 2024-06-23 | Discharge: 2024-06-23 | Disposition: A | Discharge status: Home / Self Care | Source: Ambulatory Visit | Attending: Nurse Practitioner | Admitting: Nurse Practitioner

## 2024-06-23 DIAGNOSIS — Z1231 Encounter for screening mammogram for malignant neoplasm of breast: Secondary | ICD-10-CM | POA: Diagnosis not present

## 2024-06-23 DIAGNOSIS — Z0000 Encounter for general adult medical examination without abnormal findings: Secondary | ICD-10-CM

## 2024-06-29 ENCOUNTER — Other Ambulatory Visit: Payer: Self-pay | Admitting: Nurse Practitioner

## 2024-06-29 DIAGNOSIS — R928 Other abnormal and inconclusive findings on diagnostic imaging of breast: Secondary | ICD-10-CM

## 2024-07-05 DIAGNOSIS — G894 Chronic pain syndrome: Secondary | ICD-10-CM | POA: Diagnosis not present

## 2024-07-05 DIAGNOSIS — G4733 Obstructive sleep apnea (adult) (pediatric): Secondary | ICD-10-CM | POA: Diagnosis not present

## 2024-07-05 DIAGNOSIS — M5417 Radiculopathy, lumbosacral region: Secondary | ICD-10-CM | POA: Diagnosis not present

## 2024-07-05 DIAGNOSIS — M47812 Spondylosis without myelopathy or radiculopathy, cervical region: Secondary | ICD-10-CM | POA: Diagnosis not present

## 2024-07-05 DIAGNOSIS — G43709 Chronic migraine without aura, not intractable, without status migrainosus: Secondary | ICD-10-CM | POA: Diagnosis not present

## 2024-07-06 ENCOUNTER — Ambulatory Visit
Admission: RE | Admit: 2024-07-06 | Discharge: 2024-07-06 | Disposition: A | Discharge status: Home / Self Care | Source: Ambulatory Visit | Attending: Nurse Practitioner | Admitting: Nurse Practitioner

## 2024-07-06 DIAGNOSIS — R921 Mammographic calcification found on diagnostic imaging of breast: Secondary | ICD-10-CM | POA: Diagnosis not present

## 2024-07-06 DIAGNOSIS — R928 Other abnormal and inconclusive findings on diagnostic imaging of breast: Secondary | ICD-10-CM

## 2024-07-07 ENCOUNTER — Other Ambulatory Visit: Payer: Self-pay | Admitting: Nurse Practitioner

## 2024-07-07 DIAGNOSIS — R921 Mammographic calcification found on diagnostic imaging of breast: Secondary | ICD-10-CM

## 2024-07-13 ENCOUNTER — Ambulatory Visit
Admission: RE | Admit: 2024-07-13 | Discharge: 2024-07-13 | Disposition: A | Source: Ambulatory Visit | Attending: Nurse Practitioner

## 2024-07-13 ENCOUNTER — Ambulatory Visit
Admission: RE | Admit: 2024-07-13 | Discharge: 2024-07-13 | Disposition: A | Discharge status: Home / Self Care | Source: Ambulatory Visit | Attending: Nurse Practitioner | Admitting: Nurse Practitioner

## 2024-07-13 DIAGNOSIS — R921 Mammographic calcification found on diagnostic imaging of breast: Secondary | ICD-10-CM

## 2024-07-13 HISTORY — PX: BREAST BIOPSY: SHX20

## 2024-07-18 LAB — SURGICAL PATHOLOGY

## 2024-07-26 NOTE — Progress Notes (Addendum)
 Margaret [name]                                          MRN: [id number]   08/23/2024   The [facility] Quality Team Specialist reviewed this patient medical record for the purposes of chart review for care gap closure. The following were reviewed: chart review for care gap closure-glycemic status assessment.    [facility]I Quality Team

## 2024-08-10 ENCOUNTER — Inpatient Hospital Stay

## 2024-08-10 ENCOUNTER — Inpatient Hospital Stay: Attending: Hematology and Oncology | Admitting: Hematology and Oncology

## 2024-08-10 VITALS — BP 154/86 | HR 72 | Temp 97.8°F | Resp 18 | Wt 199.9 lb

## 2024-08-10 DIAGNOSIS — N6091 Unspecified benign mammary dysplasia of right breast: Secondary | ICD-10-CM | POA: Insufficient documentation

## 2024-08-10 NOTE — Progress Notes (Signed)
 [facility] CONSULT NOTE  Patient Care Team: Delores Rojelio Caldron, NP as PCP - General (Nurse Practitioner)  CHIEF COMPLAINTS/PURPOSE OF CONSULTATION:  Newly diagnosed atypical ductal hyperplasia right breast  HISTORY OF PRESENTING ILLNESS:   History of Present Illness Margaret [name] is a 63 year old female who presents for consultation regarding atypical ductal hyperplasia.  She had a mammogram that showed calcium deposits, followed by a biopsy that diagnosed atypical ductal hyperplasia. She is here to discuss management options.  She has anxiety treated with medication. She has muscle cramps that she finds bothersome. She has no fatigue beyond her usual level.  She was diagnosed with fibromyalgia.  She has been on a weight loss journey and lost 60 pounds on Mounjaro.  She lives alone. She does not use alcohol or tobacco. She avoids pork and is considering reducing beef intake. She is on a weight management medication after switching from Ozempic.     I reviewed her records extensively and collaborated the history with the patient.  MEDICAL HISTORY:  Past Medical History:  Diagnosis Date   Allergy    Anemia    Anxiety    Arthritis    knees/elbows   Chronic back pain    Depression    Diabetes mellitus without complication (HCC)    type 2   Fibroids    Fibromyalgia    History of blood product transfusion    HSV infection    Hyperlipidemia    Hypertension    Menorrhagia    Migraines    not on medication   Morbid obesity (HCC)    Right carpal tunnel syndrome    Sleep apnea    does not use cpap    SURGICAL HISTORY: Past Surgical History:  Procedure Laterality Date   ABDOMINAL HYSTERECTOMY  07/14/2012   Procedure: HYSTERECTOMY ABDOMINAL;  Surgeon: Harland JAYSON Birkenhead, MD;  Location: [facility];  Service: Gynecology;  Laterality: N/A;   BREAST BIOPSY Right 07/13/2024   MM RT BREAST BX W LOC DEV 1ST LESION IMAGE BX SPEC STEREO GUIDE 07/13/2024 [facility]    CESAREAN SECTION     x2   COLONOSCOPY     normal   endomerial ablation     ENDOMETRIAL ABLATION     I & D EXTREMITY Left 08/23/2019   Procedure: IRRIGATION AND DEBRIDEMENT EXTREMITY left index finger;  Surgeon: Murrell Drivers, MD;  Location: [facility];  Service: Orthopedics;  Laterality: Left;   LEFT HEART CATH AND CORONARY ANGIOGRAPHY N/A 06/11/2017   Procedure: LEFT HEART CATH AND CORONARY ANGIOGRAPHY;  Surgeon: Levern Hutching, MD;  [facility];  Service: Cardiovascular;  Laterality: N/A;   NECK SURGERY     twice   ROBOTIC ASSISTED LAPAROSCOPIC LYSIS OF ADHESION  07/14/2012   Procedure: ROBOTIC ASSISTED LAPAROSCOPIC LYSIS OF ADHESION;  Surgeon: Harland JAYSON Birkenhead, MD;  Location: [facility];  Service: Gynecology;  Laterality: N/A;   TUBAL LIGATION     WISDOM TOOTH EXTRACTION      SOCIAL HISTORY: Social History   Socioeconomic History   Marital status: Legally Separated    Spouse name: Not on file   Number of children: 2   Years of education: 14   Highest education level: Not on file  Occupational History   Not on file  Tobacco Use   Smoking status: Never   Smokeless tobacco: Never  Vaping Use   Vaping status: Never Used  Substance and Sexual Activity   Alcohol use: Not Currently  Comment: 1 glass every 2-3 months   Drug use: No    Types: Marijuana    Comment: Last use Marijuana in 2015   Sexual activity: Not Currently    Birth control/protection: Abstinence  Other Topics Concern   Not on file  Social History Narrative   Patient is blind with prosthetic in R eye, daughter is support system.  Lives alone in a one story home.  Has 2 grown children.  On disability for depression.  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  Education: associates degree.     Social Drivers of Corporate Investment Banker Strain: Low Risk  (01/19/2024)   Received from [facility]   Overall Financial Resource Strain (CARDIA)    Difficulty of Paying Living Expenses: Not hard  at all  Food Insecurity: No Food Insecurity (08/10/2024)   Hunger Vital Sign    Worried About Running Out of Food in the Last Year: Never true    Ran Out of Food in the Last Year: Never true  Transportation Needs: No Transportation Needs (08/10/2024)   PRAPARE - Administrator, Civil Service (Medical): No    Lack of Transportation (Non-Medical): No  Physical Activity: Insufficiently Active (01/19/2024)   Received from [facility]   Exercise Vital Sign    On average, how many days per week do you engage in moderate to strenuous exercise (like a brisk walk)?: 3 days    On average, how many minutes do you engage in exercise at this level?: 20 min  Stress: No Stress Concern Present (01/19/2024)   Received from [facility] of Occupational Health - Occupational Stress Questionnaire    Feeling of Stress : Not at all  Social Connections: Socially Integrated (01/19/2024)   Received from [facility]   Social Network    How would you rate your social network (family, work, friends)?: Good participation with social networks  Intimate Partner Violence: Not At Risk (08/10/2024)   Humiliation, Afraid, Rape, and Kick questionnaire    Fear of Current or Ex-Partner: No    Emotionally Abused: No    Physically Abused: No    Sexually Abused: No    FAMILY HISTORY: Family History  Problem Relation Age of Onset   Hypertension Mother    Hypertension Maternal Grandmother    Heart disease Maternal Grandmother    Hypertension Maternal Uncle    Breast cancer Neg Hx     ALLERGIES:  is allergic to adhesive [tape], cortisone, naproxen, and penicillins.  MEDICATIONS:  Current Outpatient Medications  Medication Sig Dispense Refill   aspirin  EC 81 MG tablet Take 81 mg by mouth daily.     busPIRone (BUSPAR) 7.5 MG tablet Take 7.5 mg by mouth 3 (three) times daily.     DULoxetine (CYMBALTA) 60 MG capsule Take 60 mg by mouth daily.       ferrous sulfate  325 (65 FE) MG tablet  Take 325 mg by mouth daily with breakfast.     fluconazole  (DIFLUCAN ) 150 MG tablet Take 1 tablet (150 mg total) by mouth every 3 (three) days. For three doses 3 tablet 3   metoprolol  succinate (TOPROL -XL) 50 MG 24 hr tablet Take 50 mg by mouth daily.  2   Multiple Vitamin (MULTIVITAMIN WITH MINERALS) TABS tablet Take 1 tablet by mouth daily.     Propylene Glycol (SYSTANE BALANCE OP) Place 1 drop into the left eye 4 (four) times daily.  simvastatin (ZOCOR) 40 MG tablet Take 40 mg by mouth at bedtime.       tapentadol HCl (NUCYNTA) 75 MG tablet Take 75 mg by mouth 3 (three) times daily.     topiramate  (TOPAMAX ) 100 MG tablet Take 100 mg by mouth 2 (two) times daily.  2   TRADJENTA 5 MG TABS tablet Take 5 mg by mouth daily.  3   valACYclovir  (VALTREX ) 500 MG tablet Take 1 tablet (500 mg total) by mouth daily. Can increase to twice a day for 5 days in the event of a recurrence 30 tablet 12   No current facility-administered medications for this visit.    REVIEW OF SYSTEMS:   Constitutional: Denies fevers, chills or abnormal night sweats Breast:  Denies any palpable lumps or discharge All other systems were reviewed with the patient and are negative.  PHYSICAL EXAMINATION: ECOG PERFORMANCE STATUS: 2 - Symptomatic, <50% confined to bed  Vitals:   08/10/24 1544  BP: (!) 154/86  Pulse: 72  Resp: 18  Temp: 97.8 F (36.6 C)  SpO2: 100%   Filed Weights   08/10/24 1544  Weight: 199 lb 14.4 oz (90.7 kg)    GENERAL:alert, no distress and comfortable    LABORATORY DATA:  I have reviewed the data as listed Lab Results  Component Value Date   WBC 10.8 (H) 08/23/2019   HGB 11.9 (L) 08/23/2019   HCT 39.9 08/23/2019   MCV 84.9 08/23/2019   PLT 377 08/23/2019   Lab Results  Component Value Date   NA 141 08/23/2019   K 4.2 08/23/2019   CL 107 08/23/2019   CO2 21 (L) 08/23/2019    RADIOGRAPHIC STUDIES: I have personally reviewed the radiological reports and agreed with the  findings in the report.  ASSESSMENT AND PLAN:  Atypical ductal hyperplasia of right breast 07/06/2024: Indeterminate small group of calcifications with asymmetry lateral aspect of right breast 6 mm 07/13/2024: Right breast biopsy: Small focus of ADH 0.5 cm with apocrine features  Pathology review: I discussed the difference between atypical ductal hyperplasia, DCIS and invasive breast cancer. I explained to her that atypical ductal hyperplasia  is characterized by a proliferation of uniform epithelial cells filling part, but not the entirety, of the involved duct. ADH is associated with an increased risk of both ipsilateral and contralateral breast cancer and thus provides evidence of underlying breast abnormalities that predispose to breast cancer.   Recommendation: Lumpectomy: Patient is not keen on proceeding with breast lumpectomy at this time. Antiestrogen therapy with tamoxifen to reduce risk of breast cancer: Patient wants to hold off on taking tamoxifen.  Tamoxifen counseling: We discussed the risks and benefits of tamoxifen. These include but not limited to insomnia, hot flashes, mood changes, vaginal dryness, and weight gain. Although rare, serious side effects including endometrial cancer, risk of blood clots were also discussed. We strongly believe that the benefits far outweigh the risks. Patient understands these risks and consented to starting treatment. Planned treatment duration is 5 years. I discussed the role of low-dose tamoxifen. After hearing these options patient decided not to do tamoxifen.  Treatment plan: Repeat mammogram in 6 months and follow-up after that.   Assessment & Plan Atypical ductal hyperplasia of right breast ADH in the right breast is a high-risk lesion with a 10-20% risk of hidden precancerous lesions. Biopsy showed minimal calcifications, no lump, indicating low likelihood of hidden lesions. She prefers to avoid surgery and medication due to current  medication burden and potential side  effects of tamoxifen. - Scheduled follow-up mammogram in six months to monitor calcium deposits. - Encouraged non-pharmacological risk reduction: reduce red meat, increase fruits and vegetables, regular exercise. - Discussed tamoxifen for risk reduction; she prefers to avoid due to side effects and medication burden.    All questions were answered. The patient knows to call the clinic with any problems, questions or concerns. I personally spent a total of 30 minutes in the care of the patient today including preparing to see the patient, getting/reviewing separately obtained history, performing a medically appropriate exam/evaluation, counseling and educating, placing orders, referring and communicating with other health care professionals, documenting clinical information in the EHR, independently interpreting results, communicating results, and coordinating care.   Viinay K Brayson Livesey, MD 08/10/24

## 2024-08-10 NOTE — Assessment & Plan Note (Signed)
 07/06/2024: Indeterminate small group of calcifications with asymmetry lateral aspect of right breast 6 mm 07/13/2024: Right breast biopsy: Small focus of ADH 0.5 cm with apocrine features  Pathology review: I discussed the difference between atypical ductal hyperplasia, DCIS and invasive breast cancer. I explained to her that atypical ductal hyperplasia  is characterized by a proliferation of uniform epithelial cells filling part, but not the entirety, of the involved duct. ADH is associated with an increased risk of both ipsilateral and contralateral breast cancer and thus provides evidence of underlying breast abnormalities that predispose to breast cancer.   Recommendation: Lumpectomy optional Antiestrogen therapy with tamoxifen to reduce risk of breast cancer  Tamoxifen counseling: We discussed the risks and benefits of tamoxifen. These include but not limited to insomnia, hot flashes, mood changes, vaginal dryness, and weight gain. Although rare, serious side effects including endometrial cancer, risk of blood clots were also discussed. We strongly believe that the benefits far outweigh the risks. Patient understands these risks and consented to starting treatment. Planned treatment duration is 5 years. I recommended low-dose tamoxifen instead.  Return to clinic in 3 months for follow-up on tamoxifen therapy

## 2024-08-31 ENCOUNTER — Telehealth: Admitting: Physician Assistant

## 2024-08-31 DIAGNOSIS — A6004 Herpesviral vulvovaginitis: Secondary | ICD-10-CM

## 2024-08-31 MED ORDER — VALACYCLOVIR HCL 500 MG PO TABS: 500.0000 mg | ORAL_TABLET | Freq: Two times a day (BID) | ORAL | 0 refills | Status: AC

## 2024-08-31 NOTE — Progress Notes (Signed)
 E-Visit for Herpes Simplex  We are sorry that you are not feeling well.  Here is how we plan to help!  Based on what you have shared ith me, it looks like you may be having an outbreak/flare-up of genital herpes.    I have prescribed I have prescribed Valacyclovir 500 mg Take one by mouth twice a day for 3 days.    If you have been prescribed long term medications to be taken on a regular basis, it is important to follow the recommendations and take them as ordered.    Outbreaks usually include blisters and open sores in the genital area. Outbreaks that happen after the first time are usually not as severe and do not last as long. Genital Herpes Simplex is a commonly sexually transmitted viral infection that is found worldwide. Most of these genital infections are caused by one or two herpes simplex viruses that is passed from person to person during vaginal, oral, or anal sex. Sometimes, people do not know they have herpes because they do not have any symptoms.  Please be aware that if you have genital herpes you can be contagious even when you are not having rash or flare-up and you may not have any symptoms, even when you are taking suppressive medicines.  Herpes cannot be cured. The disease usually causes most problems during the first few years. After that, the virus is still there, but it causes few to no symptoms. Even when the virus is active, people with herpes can take medicines to reduce and help prevent symptoms.  Herpes is an infection that can cause blisters and open sores on the genital area. Herpes is caused by a virus that is passed from person to person during vaginal, oral, or anal sex. Sometimes, people do not know they have herpes because they do not have any symptoms. Herpes cannot be cured. The disease usually causes most problems during the first few years. After that, the virus is still there, but it causes few to no symptoms. Even when the virus is active, people with herpes  can take medicines to reduce and help prevent symptoms.  If you have been prescribed medications to be taken on a regular basis, it is important to follow the recommendations and take them as ordered.  Some people with herpes never have any symptoms. But other people can develop symptoms within a few weeks of being infected with the herpes virus   Symptoms usually include blisters in the genital area. In women, this area includes the vagina, buttocks, anus, or thighs. In men, this area includes the penis, scrotum, anus, butt, or thighs. The blisters can become painful open sores, which then crust over as they heal. Sometimes, people can have other symptoms that include:  ?Blisters on the mouth or lips ?Fever, headache, or pain in the joints ?Trouble urinating  Outbreaks might occur every month or more often, or just once or twice a year. Sometimes, people can tell when an outbreak will occur, because they feel itching or pain beforehand. Sometimes they do not know that an outbreak is coming because they have no symptoms. Whatever your pattern is, keep in mind that herpes outbreaks usually become less frequent over time as you get older. Certain things, called "triggers," can make outbreaks more likely to occur. These include stress, sunlight, menstrual periods,or getting sick.  Antiviral therapy can shorten the duration of symptoms and signs in primary infection, which, when untreated, can be associated with significant increase in the  symptoms of the disease.  HOME CARE Use a portable bath (such as a "Sitz bath") where you can sit in warm water for about 20 minutes. Your bathtub could also work. Avoid bubble baths.  Keep the genital area clean and dry and avoid tight clothes.  Take over-the-counter pain medicine such as acetaminophen (brand name: Tylenol) or ibuprofen sample brand names: Advil, Motrin). But avoid aspirin.  Only take medications as instructed by your medical team.  You are  most likely to spread herpes to a sex partner when you have blisters and open sores on your body. But it's also possible to spread herpes to your partner when you do not have any symptoms. That is because herpes can be present on your body without causing any symptoms, like blisters or pain.  Telling your sex partner that you have herpes can be hard. But it can help protect them, since there are ways to lower the risk of spreading the infection.   Using a condom every time you have sex  Not having sex when you have symptoms  Not having oral sex if you have blisters or open sores (in the genital area or around your mouth)  MAKE SURE YOU   Understand these instructions. Do not have sex without using a condom until you have been seen by a doctor and as instructed by the provider If you are not better or improved within 7 days, you MUST have a follow up at your doctor or the health department for evaluation. There are other causes of rashes in the genital region.  Thank you for choosing an e-visit.  Your e-visit answers were reviewed by a board certified advanced clinical practitioner to complete your personal care plan. Depending upon the condition, your plan could have included both over the counter or prescription medications.  Please review your pharmacy choice. Make sure the pharmacy is open so you can pick up prescription now. If there is a problem, you may contact your provider through [employer] and have the prescription routed to another pharmacy.  Your safety is important to [place]. If you have drug allergies check your prescription carefully.   For the next 24 hours you can use MyChart to ask questions about today's visit, request a non-urgent call back, or ask for a work or school excuse. You will get an email in the next two days asking about your experience. I hope that your e-visit has been valuable and will speed your recovery.   I have spent 5 minutes in review of e-visit  questionnaire, review and updating patient chart, medical decision making and response to patient.   Margaretann Loveless, PA-C

## 2024-09-22 ENCOUNTER — Encounter: Payer: Self-pay | Admitting: *Deleted

## 2024-09-22 NOTE — Progress Notes (Signed)
 Margaret [name]                                          MRN: [id number]   09/22/2024   The [facility] Quality Team Specialist reviewed this patient medical record for the purposes of chart review for care gap closure. The following were reviewed: chart review for care gap closure-glycemic status assessment.    [facility] Quality Team

## 2025-02-15 ENCOUNTER — Inpatient Hospital Stay: Admitting: Hematology and Oncology

## 2025-05-24 ENCOUNTER — Ambulatory Visit: Admitting: Podiatry
